# Patient Record
Sex: Male | Born: 1945
Health system: Southern US, Community
[De-identification: ages and names within clinical notes are randomized; demographics above are authoritative.]

## PROBLEM LIST (undated history)

## (undated) DIAGNOSIS — R42 Dizziness and giddiness: Secondary | ICD-10-CM

## (undated) DIAGNOSIS — I10 Essential (primary) hypertension: Secondary | ICD-10-CM

## (undated) DIAGNOSIS — C801 Malignant (primary) neoplasm, unspecified: Secondary | ICD-10-CM

## (undated) HISTORY — PX: OTHER SURGICAL HISTORY: SHX169

## (undated) HISTORY — PX: PROSTATE SURGERY: SHX751

## (undated) HISTORY — PX: SMALL INTESTINE SURGERY: SHX150

## (undated) HISTORY — DX: Malignant (primary) neoplasm, unspecified: C80.1

---

## 2002-09-18 ENCOUNTER — Emergency Department (HOSPITAL_COMMUNITY): Admission: EM | Admit: 2002-09-18 | Discharge: 2002-09-18 | Payer: Self-pay | Admitting: Emergency Medicine

## 2002-10-05 ENCOUNTER — Encounter: Payer: Self-pay | Admitting: Emergency Medicine

## 2002-10-05 ENCOUNTER — Encounter: Admission: RE | Admit: 2002-10-05 | Discharge: 2002-10-05 | Payer: Self-pay | Admitting: Emergency Medicine

## 2003-01-11 ENCOUNTER — Encounter: Payer: Self-pay | Admitting: Urology

## 2003-01-14 ENCOUNTER — Inpatient Hospital Stay (HOSPITAL_COMMUNITY): Admission: RE | Admit: 2003-01-14 | Discharge: 2003-01-17 | Payer: Self-pay | Admitting: Urology

## 2003-01-14 ENCOUNTER — Encounter (INDEPENDENT_AMBULATORY_CARE_PROVIDER_SITE_OTHER): Payer: Self-pay | Admitting: Specialist

## 2004-10-04 ENCOUNTER — Encounter: Admission: RE | Admit: 2004-10-04 | Discharge: 2004-10-04 | Payer: Self-pay | Admitting: Emergency Medicine

## 2005-04-10 ENCOUNTER — Encounter: Admission: RE | Admit: 2005-04-10 | Discharge: 2005-04-10 | Payer: Self-pay | Admitting: Emergency Medicine

## 2005-05-02 ENCOUNTER — Emergency Department (HOSPITAL_COMMUNITY): Admission: EM | Admit: 2005-05-02 | Discharge: 2005-05-02 | Payer: Self-pay | Admitting: Emergency Medicine

## 2007-09-14 ENCOUNTER — Encounter: Admission: RE | Admit: 2007-09-14 | Discharge: 2007-09-14 | Payer: Self-pay | Admitting: Emergency Medicine

## 2011-03-26 NOTE — Op Note (Signed)
NAME:  Maurice Vang, Maurice Vang                          ACCOUNT NO.:  [id number]   MEDICAL RECORD NO.:  [id number]                   PATIENT TYPE:  INP   LOCATION:  0340                                 FACILITY:  [facility]   PHYSICIAN:  Lindaann Slough, M.D.               DATE OF BIRTH:  [birth year]   DATE OF PROCEDURE:  01/14/2003  DATE OF DISCHARGE:                                 OPERATIVE REPORT   PREOPERATIVE DIAGNOSIS:  Adenocarcinoma of prostate.   POSTOPERATIVE DIAGNOSIS:  Adenocarcinoma of prostate.   PROCEDURE DONE:  1. Bilateral pelvic lymphadenectomy.  2. Radical retropubic prostatectomy.   SURGEON:  Lindaann Slough, M.D.   ASSISTANTS:  1. Valetta Fuller, M.D.  2. Crecencio Mc, M.D.   ANESTHESIA:  General.   INDICATION:  The patient is a 65 year old male, who had an elevated PSA.  His PSA was 6.28 with a free PSA of 7.6%.  Biopsies positive for  adenocarcinoma, Gleason score 7.  Treatment options were discussed with the  patient and his wife.  They chose to have a radical prostatectomy.  The  risks, benefits, and alternatives to the procedure were discussed with the  patient and his wife, and they understand and are willing to proceed.   DESCRIPTION OF PROCEDURE:  Under general anesthesia, the patient was prepped  and draped and placed in the supine position.  A longitudinal incision was  made from the symphysis pubis to about 2 cm below the umbilicus.  The  incision was carried down to the rectus fascia which was then incised.  The  recti muscles were split in the midline, and the iliac fossae were then  entered.  Bilateral pelvic lymphadenectomy was done between the pelvic  sidewalls, the external iliac vessels in the bladder wall, and in the  obturator nerve and vessels as landmarks.  The specimen was sent for  permanent section.  Then the endopelvic fascia was incised from the apex at  the base of the prostate on either side.  The loose areolar tissues anterior  to the  bladder neck were ligated with #0 Vicryl and cut in between  ligatures.  Then the puboprostatic ligaments were then incised.  A  Hohenfellner clamp was then passed behind the dorsal vein complex, and the  dorsal vein complex was doubly ligated with #1 Vicryl and cut in between  ligatures.  Then the urethra was bluntly and sharply dissected from the  anterior wall of the rectum, and the anterior wall of the urethra was  incised.  The Foley catheter that was previously placed in the bladder was  then pulled through the incision, and the posterior wall of the urethra was  incised.  The prostate was then bluntly and sharply dissected from the  anterior wall of the rectum.  Although the dissection was difficult, it was  done without rectal injury.  Then the lateral pedicles were then ligated  with #0 silk and cut in between ligatures.  It was at that point difficult  to obtain a clear plane between the seminal vesicals and the Denonvilliers  fascia.  At this point, attention was then turned to the bladder neck which  was then incised.  The dissection was then continued around the bladder  neck, and the anterior bladder neck was incised, and the Foley catheter was  pulled through the incision, and then the posterior bladder neck was  incised.  The dissection was then continued posteriorly, and the plane  between the bladder and the seminal vesical was then obtained, and the  seminal vesicals were dissected from the posterior wall of the bladder.  Then the vas on the left side was identified and freed from the surrounding  tissues and ligated with hemoclips.  Then the same procedure was done on the  right side.  Then the Denonvilliers fascia posterior to the seminal vesical  was then incised, and the dissection was then completed around the seminal  vesicals, and the specimen was removed in toto.  Hemostasis was then  completed with ligatures.  The mucosa of the bladder neck was then everted  with  #4-0 chromic.  Then a [place] sound was passed through the urethra,  and five sutures of #2-0 Monocryl were passed through the urethra at the 2,  the 5, the 7, the 10, and the 12 o'clock position.  The [place] sound was  then removed.  A #20 French Foley catheter was then passed through the  urethra and into the bladder, and the urethral sutures were then  approximated to the bladder neck.  The wound was then irrigated with normal  saline.  A Blake drain was placed in the Retzius space through a separate  stab wound.  Then the fascia was closed with #0 PDS, and the skin was closed  with #4-0 Monocryl, using subcuticular sutures.   ESTIMATED BLOOD LOSS:  2500 mL.   BLOOD REPLACEMENT:  None.   Needle, sponge, and instrument counts were correct on two occasions.   The patient tolerated the procedure well and left the OR in satisfactory  condition to postanesthesia care unit.                                               Lindaann Slough, M.D.    MN/MEDQ  D:  01/14/2003  T:  01/14/2003  Job:  [id number]   cc:   Reuben Likes, M.D.  [address]  [place]  [place] [address]  Fax: [phone]   Valetta Fuller, M.D.  [address]  [place]  [place] [address]  Fax: [phone]   Crecencio Mc, M.D.  [address]  [place], [place] [address]  Fax: [phone]

## 2011-03-26 NOTE — H&P (Signed)
NAME:  Maurice Vang, Maurice Vang                          ACCOUNT NO.:  [id number]   MEDICAL RECORD NO.:  [id number]                   PATIENT TYPE:  INP   LOCATION:  X006                                 FACILITY:  [facility]   PHYSICIAN:  Lindaann Slough, M.D.               DATE OF BIRTH:  May 04, 1946   DATE OF ADMISSION:  01/14/2003  DATE OF DISCHARGE:                                HISTORY & PHYSICAL   CHIEF COMPLAINT:  Adenocarcinoma of prostate, posterior.   HISTORY OF PRESENT ILLNESS:  The patient is a 65 year old male who had a  positive prostate biopsy for adenocarcinoma, Gleason score 7.  His PSA was  6.28, with a free PSA of 7.6%.  Treatment options were discussed with the  patient and his wife, and they chose to have a radical prostatectomy.  He is  admitted today for the procedure.   PAST MEDICAL HISTORY:  1. History of hypertension.  2. Sigmoid colon resection in 1992 for sigmoid diverticulitis.   ALLERGIES:  CODEINE.   FAMILY HISTORY:  Father and mother died of a stroke.   SOCIAL HISTORY:  He is married, has two children.  He quit smoking 24 years  ago, and does not drink.   MEDICATIONS:  1. Furosemide 40 mg daily.  2. Allegra 180 mg p.r.n.  3. Nasonex nasal spray 50 mcg p.r.n.   REVIEW OF SYMPTOMS:  No cough, no shortness of breath, no hemoptysis.  CARDIOVASCULAR:  No palpitations, no chest pain.  GASTROINTESTINAL:  No  nausea or vomiting, no diarrhea, no constipation.  GENITOURINARY:  He has no  frequency, hesitancy, dysuria, or straining on urination.  He has been  having difficulty achieving erections, and woke up was suggestive of  vasculogenic impotence.   PHYSICAL EXAMINATION:  VITAL SIGNS:  Blood pressure 132/78, pulse 70,  respirations 16, temperature 98.  HEENT:  His head is normal.  Pupils equal, round, reactive to light and  accommodation.  Ears, nose, and throat are within normal limits.  NECK:  Supple, no cervical lymph nodes, no thyromegaly.  CHEST:   Symmetrical.  LUNGS:  Fully expanded and clear to auscultation and percussion.  HEART:  Regular rhythm, no murmurs, rubs, or gallops.  ABDOMEN:  Soft.  He has a well-healed surgical scar.  Liver, spleen, and  kidneys are not palpable.  Bowel sounds are normal.  GENITALIA:  Penis and scrotal contents are within normal limits.  EXTREMITIES:  He has decreased pretibial pulses.  RECTAL:  He has no external hemorrhoids, sphincter tone is normal, prostate  is enlarged, 40 g, no nodules.  Seminal vesicles not palpable.   IMPRESSION:  1. Adenocarcinoma of prostate.  2. Peripheral vascular disease.  3. Hypertension.  Lindaann Slough, M.D.    MN/MEDQ  D:  01/14/2003  T:  01/14/2003  Job:  [id number]

## 2011-03-26 NOTE — Discharge Summary (Signed)
NAME:  Maurice Vang, Maurice Vang                          ACCOUNT NO.:  [id number]   MEDICAL RECORD NO.:  [id number]                   PATIENT TYPE:  INP   LOCATION:  0340                                 FACILITY:  [facility]   PHYSICIAN:  Lindaann Slough, M.D.               DATE OF BIRTH:  04-30-1946   DATE OF ADMISSION:  01/14/2003  DATE OF DISCHARGE:  01/17/2003                                 DISCHARGE SUMMARY   DISCHARGE DIAGNOSES:  1. Adenocarcinoma of prostate.  2. Hypertension.  3. Peripheral vascular disease.   PROCEDURE:  Bilateral pelvic lymphadenectomy and radical retropubic  prostatectomy on 01/14/03.   HISTORY OF PRESENT ILLNESS:  The patient is a 65 year old male who had a  positive biopsy for adenocarcinoma of prostate, Gleason score 7.  PSA was  6.28, with a free PSA of 7.6%.  The patient chose to have a radical  prostatectomy, and he was admitted on 01/14/03, for the procedure.   PHYSICAL EXAMINATION:  VITAL SIGNS:  Blood pressure was 132/78, pulse 70,  respirations 16, temperature 98.  HEENT:  Head is normal.  Pupils equal, round, reactive to light and  accommodation.  Ears, nose, and throat within normal limits.  NECK:  Supple, no cervical lymph nodes, no thyromegaly.  CHEST:  Asymmetrical.  LUNGS:  Fully expanded and clear to auscultation and percussion.  HEART:  Regular rhythm.  No murmurs, no gallops.  ABDOMEN:  Soft, he has a well-healed surgical scar.  Liver, spleen, and  kidneys not palpable.  Bowel sounds normal.  GENITOURINARY:  Penis and scrotal contents within normal limits.  RECTAL:  Sphincter tone is normal.  Prostate was enlarged at 40 g, no  nodules.  Seminal vesicles not palpable.   LABORATORY DATA:  His hemoglobin on admission was 14.9, hematocrit of 43.6,  and white blood cell count of 3.8.  Sodium 140, potassium 3.9, BUN 16,  creatinine 1.3, glucose 110.  Urinalysis is normal.  PT and PTT were within  normal limits.   Chest x-ray showed no evidence of  active disease.   EKG showed a left axis deviation.   Urine culture is negative.   HOSPITAL COURSE:  The patient had bilateral pelvic lymphadenectomy and  radical retropubic prostatectomy on 01/14/03.   His hemoglobin went down to 7 on the first day postoperatively.  However,  the patient was stable.  His vital signs were stable, and he was ambulating  well, and it was decided not to transfuse.   The postoperative course was otherwise uneventful.  He tolerated his diet  well the first two days postoperatively, and the Foley catheter was draining  clear urine.  In the morning of 01/17/03, he had one episode of nausea and  vomiting, and he was kept for observation for the rest of the day, and he  tolerated his lunch and dinner well, and he was then discharged home on  01/17/03.  DISCHARGE MEDICATIONS:  1. Levaquin 250 mg p.o. daily, to stop taking on 01/26/03, two days before     removal of the Foley catheter.  2. Ferrous sulfate one tablet p.o. t.i.d.  3. Colace 100 mg p.o. t.i.d.  4. Vicodin one or two tablets p.o. q.4h. p.r.n. pain.  5. Furosemide 40 mg p.o. daily.  6. Allegra 180 mg p.r.n.  7. Nasonex 50 mcg p.r.n.   ACTIVITY:  He is instructed not to do any lifting, straining, or driving  until further advised.   CONDITION ON DISCHARGE:  Improved.   DIET:  Regular.   FOLLOWUP:  The patient will be seen in the office on 01/28/03, for removal of  the Foley catheter.   PATHOLOGY REPORT:  Pathology report showed adenocarcinoma of the prostate,  Gleason score 7, lymph nodes are negative, and the margins are negative.                                               Lindaann Slough, M.D.    MN/MEDQ  D:  01/17/2003  T:  01/17/2003  Job:  [id number]   cc:   Reuben Likes, M.D.  [address]  [place]  [place] [address]  Fax: [phone]

## 2011-08-10 ENCOUNTER — Other Ambulatory Visit: Payer: Self-pay | Admitting: Gastroenterology

## 2012-07-06 DIAGNOSIS — E663 Overweight: Secondary | ICD-10-CM | POA: Diagnosis not present

## 2012-07-06 DIAGNOSIS — Z23 Encounter for immunization: Secondary | ICD-10-CM | POA: Diagnosis not present

## 2012-07-06 DIAGNOSIS — I10 Essential (primary) hypertension: Secondary | ICD-10-CM | POA: Diagnosis not present

## 2012-07-06 DIAGNOSIS — Z0000 Encounter for general adult medical examination without abnormal findings: Secondary | ICD-10-CM | POA: Diagnosis not present

## 2012-07-06 DIAGNOSIS — G4730 Sleep apnea, unspecified: Secondary | ICD-10-CM | POA: Diagnosis not present

## 2012-07-26 DIAGNOSIS — R6889 Other general symptoms and signs: Secondary | ICD-10-CM | POA: Diagnosis not present

## 2012-07-26 DIAGNOSIS — R109 Unspecified abdominal pain: Secondary | ICD-10-CM | POA: Diagnosis not present

## 2012-07-26 DIAGNOSIS — M25519 Pain in unspecified shoulder: Secondary | ICD-10-CM | POA: Diagnosis not present

## 2012-07-26 DIAGNOSIS — J329 Chronic sinusitis, unspecified: Secondary | ICD-10-CM | POA: Diagnosis not present

## 2012-07-31 ENCOUNTER — Other Ambulatory Visit: Payer: Self-pay | Admitting: Orthopedic Surgery

## 2012-07-31 DIAGNOSIS — M25512 Pain in left shoulder: Secondary | ICD-10-CM

## 2012-07-31 DIAGNOSIS — M25519 Pain in unspecified shoulder: Secondary | ICD-10-CM | POA: Diagnosis not present

## 2012-08-05 ENCOUNTER — Ambulatory Visit
Admission: RE | Admit: 2012-08-05 | Discharge: 2012-08-05 | Disposition: A | Discharge status: Home / Self Care | Payer: Medicare Other | Source: Ambulatory Visit | Attending: Orthopedic Surgery | Admitting: Orthopedic Surgery

## 2012-08-05 DIAGNOSIS — M25519 Pain in unspecified shoulder: Secondary | ICD-10-CM | POA: Diagnosis not present

## 2012-08-05 DIAGNOSIS — M25512 Pain in left shoulder: Secondary | ICD-10-CM

## 2012-08-07 DIAGNOSIS — M25519 Pain in unspecified shoulder: Secondary | ICD-10-CM | POA: Diagnosis not present

## 2013-08-24 DIAGNOSIS — G4730 Sleep apnea, unspecified: Secondary | ICD-10-CM | POA: Diagnosis not present

## 2013-08-24 DIAGNOSIS — J069 Acute upper respiratory infection, unspecified: Secondary | ICD-10-CM | POA: Diagnosis not present

## 2013-08-24 DIAGNOSIS — I10 Essential (primary) hypertension: Secondary | ICD-10-CM | POA: Diagnosis not present

## 2014-02-22 DIAGNOSIS — I10 Essential (primary) hypertension: Secondary | ICD-10-CM | POA: Diagnosis not present

## 2014-02-22 DIAGNOSIS — J309 Allergic rhinitis, unspecified: Secondary | ICD-10-CM | POA: Diagnosis not present

## 2014-03-20 ENCOUNTER — Ambulatory Visit (INDEPENDENT_AMBULATORY_CARE_PROVIDER_SITE_OTHER): Payer: Medicare Other | Admitting: Emergency Medicine

## 2014-03-20 VITALS — BP 120/82 | HR 82 | Temp 97.7°F | Resp 16 | Ht 70.0 in | Wt 234.4 lb

## 2014-03-20 DIAGNOSIS — S335XXA Sprain of ligaments of lumbar spine, initial encounter: Secondary | ICD-10-CM

## 2014-03-20 MED ORDER — NAPROXEN SODIUM 550 MG PO TABS: 550.0000 mg | ORAL_TABLET | Freq: Two times a day (BID) | ORAL | Status: AC

## 2014-03-20 MED ORDER — CYCLOBENZAPRINE HCL 10 MG PO TABS: 10.0000 mg | ORAL_TABLET | Freq: Three times a day (TID) | ORAL | Status: DC | PRN

## 2014-03-20 NOTE — Patient Instructions (Signed)
Lumbosacral Strain Lumbosacral strain is a strain of any of the parts that make up your lumbosacral vertebrae. Your lumbosacral vertebrae are the bones that make up the lower third of your backbone. Your lumbosacral vertebrae are held together by muscles and tough, fibrous tissue (ligaments).  CAUSES  A sudden blow to your back can cause lumbosacral strain. Also, anything that causes an excessive stretch of the muscles in the low back can cause this strain. This is typically seen when people exert themselves strenuously, fall, lift heavy objects, bend, or crouch repeatedly. RISK FACTORS  Physically demanding work.  Participation in pushing or pulling sports or sports that require sudden twist of the back (tennis, golf, baseball).  Weight lifting.  Excessive lower back curvature.  Forward-tilted pelvis.  Weak back or abdominal muscles or both.  Tight hamstrings. SIGNS AND SYMPTOMS  Lumbosacral strain may cause pain in the area of your injury or pain that moves (radiates) down your leg.  DIAGNOSIS Your health care provider can often diagnose lumbosacral strain through a physical exam. In some cases, you may need tests such as X-ray exams.  TREATMENT  Treatment for your lower back injury depends on many factors that your clinician will have to evaluate. However, most treatment will include the use of anti-inflammatory medicines. HOME CARE INSTRUCTIONS   Avoid hard physical activities (tennis, racquetball, waterskiing) if you are not in proper physical condition for it. This may aggravate or create problems.  If you have a back problem, avoid sports requiring sudden body movements. Swimming and walking are generally safer activities.  Maintain good posture.  Maintain a healthy weight.  For acute conditions, you may put ice on the injured area.  Put ice in a plastic bag.  Place a towel between your skin and the bag.  Leave the ice on for 20 minutes, 2 3 times a day.  When the  low back starts healing, stretching and strengthening exercises may be recommended. SEEK MEDICAL CARE IF:  Your back pain is getting worse.  You experience severe back pain not relieved with medicines. SEEK IMMEDIATE MEDICAL CARE IF:   You have numbness, tingling, weakness, or problems with the use of your arms or legs.  There is a change in bowel or bladder control.  You have increasing pain in any area of the body, including your belly (abdomen).  You notice shortness of breath, dizziness, or feel faint.  You feel sick to your stomach (nauseous), are throwing up (vomiting), or become sweaty.  You notice discoloration of your toes or legs, or your feet get very cold. MAKE SURE YOU:   Understand these instructions.  Will watch your condition.  Will get help right away if you are not doing well or get worse. Document Released: 08/04/2005 Document Revised: 08/15/2013 Document Reviewed: 06/13/2013 Exit[employer] Patient Information 2014 [employer].  

## 2014-03-20 NOTE — Progress Notes (Signed)
[facility] [address] [place] [address] [phone]  Date:  03/20/2014   Name:  Maurice Vang   DOB:  12-24-1945   MRN:  [id number]  PCP:  No primary provider on file.    Chief Complaint: Back Pain   History of Present Illness:  Maurice Vang is a 68 y.o. very pleasant male patient who presents with the following:  Injured his right low back getting out of a car and twisted.  Has pain that radiates into anterior right thigh.  Had difficulty with getting out of bed this morning due pain and required assistance. No neuro symptoms or weakness.  No history or prior sciatica.  No improvement with over the counter medications or other home remedies. Denies other complaint or health concern today.   There are no active problems to display for this patient.   Past Medical History  Diagnosis Date  . Cancer     prostate    Past Surgical History  Procedure Laterality Date  . Prostate cancer    . Prostate surgery    . Small intestine surgery      History  Substance Use Topics  . Smoking status: Former Research scientist (life sciences)  . Smokeless tobacco: Not on file  . Alcohol Use: No    Family History  Problem Relation Age of Onset  . Stroke Mother   . Stroke Father     Allergies  Allergen Reactions  . Codeine Nausea And Vomiting    Medication list has been reviewed and updated.  No current outpatient prescriptions on file prior to visit.   No current facility-administered medications on file prior to visit.    Review of Systems:  As per HPI, otherwise negative.    Physical Examination: Filed Vitals:   03/20/14 1153  BP: 120/82  Pulse: 82  Temp: 97.7 F (36.5 C)  Resp: 16   Filed Vitals:   03/20/14 1153  Height: 5\' 10"  (1.778 m)  Weight: 234 lb 6.4 oz (106.323 kg)   Body mass index is 33.63 kg/(m^2). Ideal Body Weight: Weight in (lb) to have BMI = 25: 173.9  GEN: WDWN, NAD, Non-toxic, A & O x 3 HEENT: Atraumatic, Normocephalic. Neck  supple. No masses, No LAD. Ears and Nose: No external deformity. CV: RRR, No M/G/R. No JVD. No thrill. No extra heart sounds. PULM: CTA B, no wheezes, crackles, rhonchi. No retractions. No resp. distress. No accessory muscle use. ABD: S, NT, ND, +BS. No rebound. No HSM. EXTR: No c/c/e NEURO Normal gait.  PSYCH: Normally interactive. Conversant. Not depressed or anxious appearing.  Calm demeanor.  Back:  Tender right lower back neuro intact  Assessment and Plan: Lumbar strain Anaprox Flexeril  Signed,  Ellison Carwin, MD   2

## 2014-06-14 DIAGNOSIS — M545 Low back pain, unspecified: Secondary | ICD-10-CM | POA: Diagnosis not present

## 2015-02-15 DIAGNOSIS — H25043 Posterior subcapsular polar age-related cataract, bilateral: Secondary | ICD-10-CM | POA: Diagnosis not present

## 2015-03-07 DIAGNOSIS — C61 Malignant neoplasm of prostate: Secondary | ICD-10-CM | POA: Diagnosis not present

## 2015-03-07 DIAGNOSIS — G4730 Sleep apnea, unspecified: Secondary | ICD-10-CM | POA: Diagnosis not present

## 2015-03-07 DIAGNOSIS — Z1389 Encounter for screening for other disorder: Secondary | ICD-10-CM | POA: Diagnosis not present

## 2015-03-07 DIAGNOSIS — I10 Essential (primary) hypertension: Secondary | ICD-10-CM | POA: Diagnosis not present

## 2015-03-07 DIAGNOSIS — Z0001 Encounter for general adult medical examination with abnormal findings: Secondary | ICD-10-CM | POA: Diagnosis not present

## 2015-03-07 DIAGNOSIS — R609 Edema, unspecified: Secondary | ICD-10-CM | POA: Diagnosis not present

## 2015-09-05 DIAGNOSIS — Z6835 Body mass index (BMI) 35.0-35.9, adult: Secondary | ICD-10-CM | POA: Diagnosis not present

## 2015-09-05 DIAGNOSIS — G4730 Sleep apnea, unspecified: Secondary | ICD-10-CM | POA: Diagnosis not present

## 2015-09-05 DIAGNOSIS — E663 Overweight: Secondary | ICD-10-CM | POA: Diagnosis not present

## 2015-09-05 DIAGNOSIS — I10 Essential (primary) hypertension: Secondary | ICD-10-CM | POA: Diagnosis not present

## 2015-12-29 DIAGNOSIS — M25561 Pain in right knee: Secondary | ICD-10-CM | POA: Diagnosis not present

## 2015-12-30 DIAGNOSIS — M25561 Pain in right knee: Secondary | ICD-10-CM | POA: Diagnosis not present

## 2016-01-06 DIAGNOSIS — M25561 Pain in right knee: Secondary | ICD-10-CM | POA: Diagnosis not present

## 2016-01-10 DIAGNOSIS — M25561 Pain in right knee: Secondary | ICD-10-CM | POA: Diagnosis not present

## 2016-01-13 DIAGNOSIS — M25561 Pain in right knee: Secondary | ICD-10-CM | POA: Diagnosis not present

## 2016-01-28 DIAGNOSIS — Y939 Activity, unspecified: Secondary | ICD-10-CM | POA: Diagnosis not present

## 2016-01-28 DIAGNOSIS — S83281A Other tear of lateral meniscus, current injury, right knee, initial encounter: Secondary | ICD-10-CM | POA: Diagnosis not present

## 2016-01-28 DIAGNOSIS — S83231A Complex tear of medial meniscus, current injury, right knee, initial encounter: Secondary | ICD-10-CM | POA: Diagnosis not present

## 2016-01-28 DIAGNOSIS — Y999 Unspecified external cause status: Secondary | ICD-10-CM | POA: Diagnosis not present

## 2016-01-28 DIAGNOSIS — X58XXXA Exposure to other specified factors, initial encounter: Secondary | ICD-10-CM | POA: Diagnosis not present

## 2016-01-28 DIAGNOSIS — G8918 Other acute postprocedural pain: Secondary | ICD-10-CM | POA: Diagnosis not present

## 2016-01-28 DIAGNOSIS — Y929 Unspecified place or not applicable: Secondary | ICD-10-CM | POA: Diagnosis not present

## 2016-01-28 DIAGNOSIS — M179 Osteoarthritis of knee, unspecified: Secondary | ICD-10-CM | POA: Diagnosis not present

## 2016-01-28 DIAGNOSIS — S83241A Other tear of medial meniscus, current injury, right knee, initial encounter: Secondary | ICD-10-CM | POA: Diagnosis not present

## 2016-02-02 DIAGNOSIS — M25561 Pain in right knee: Secondary | ICD-10-CM | POA: Diagnosis not present

## 2016-02-02 DIAGNOSIS — R262 Difficulty in walking, not elsewhere classified: Secondary | ICD-10-CM | POA: Diagnosis not present

## 2016-02-03 DIAGNOSIS — S83281D Other tear of lateral meniscus, current injury, right knee, subsequent encounter: Secondary | ICD-10-CM | POA: Diagnosis not present

## 2016-02-03 DIAGNOSIS — S83241D Other tear of medial meniscus, current injury, right knee, subsequent encounter: Secondary | ICD-10-CM | POA: Diagnosis not present

## 2016-02-04 DIAGNOSIS — M25561 Pain in right knee: Secondary | ICD-10-CM | POA: Diagnosis not present

## 2016-02-04 DIAGNOSIS — R262 Difficulty in walking, not elsewhere classified: Secondary | ICD-10-CM | POA: Diagnosis not present

## 2016-02-09 DIAGNOSIS — M25561 Pain in right knee: Secondary | ICD-10-CM | POA: Diagnosis not present

## 2016-02-09 DIAGNOSIS — R262 Difficulty in walking, not elsewhere classified: Secondary | ICD-10-CM | POA: Diagnosis not present

## 2016-02-11 DIAGNOSIS — R262 Difficulty in walking, not elsewhere classified: Secondary | ICD-10-CM | POA: Diagnosis not present

## 2016-02-11 DIAGNOSIS — M25561 Pain in right knee: Secondary | ICD-10-CM | POA: Diagnosis not present

## 2016-02-16 DIAGNOSIS — R262 Difficulty in walking, not elsewhere classified: Secondary | ICD-10-CM | POA: Diagnosis not present

## 2016-02-16 DIAGNOSIS — M25561 Pain in right knee: Secondary | ICD-10-CM | POA: Diagnosis not present

## 2016-02-18 DIAGNOSIS — R262 Difficulty in walking, not elsewhere classified: Secondary | ICD-10-CM | POA: Diagnosis not present

## 2016-02-18 DIAGNOSIS — M25561 Pain in right knee: Secondary | ICD-10-CM | POA: Diagnosis not present

## 2016-02-24 DIAGNOSIS — M25561 Pain in right knee: Secondary | ICD-10-CM | POA: Diagnosis not present

## 2016-02-24 DIAGNOSIS — R262 Difficulty in walking, not elsewhere classified: Secondary | ICD-10-CM | POA: Diagnosis not present

## 2016-03-01 DIAGNOSIS — M25561 Pain in right knee: Secondary | ICD-10-CM | POA: Diagnosis not present

## 2016-03-01 DIAGNOSIS — R262 Difficulty in walking, not elsewhere classified: Secondary | ICD-10-CM | POA: Diagnosis not present

## 2016-03-08 DIAGNOSIS — Z0000 Encounter for general adult medical examination without abnormal findings: Secondary | ICD-10-CM | POA: Diagnosis not present

## 2016-03-08 DIAGNOSIS — I10 Essential (primary) hypertension: Secondary | ICD-10-CM | POA: Diagnosis not present

## 2016-03-08 DIAGNOSIS — Z6835 Body mass index (BMI) 35.0-35.9, adult: Secondary | ICD-10-CM | POA: Diagnosis not present

## 2016-03-08 DIAGNOSIS — Z125 Encounter for screening for malignant neoplasm of prostate: Secondary | ICD-10-CM | POA: Diagnosis not present

## 2016-03-08 DIAGNOSIS — G4730 Sleep apnea, unspecified: Secondary | ICD-10-CM | POA: Diagnosis not present

## 2016-03-08 DIAGNOSIS — Z1389 Encounter for screening for other disorder: Secondary | ICD-10-CM | POA: Diagnosis not present

## 2016-03-08 DIAGNOSIS — K635 Polyp of colon: Secondary | ICD-10-CM | POA: Diagnosis not present

## 2016-03-08 DIAGNOSIS — E663 Overweight: Secondary | ICD-10-CM | POA: Diagnosis not present

## 2016-03-08 DIAGNOSIS — Z8546 Personal history of malignant neoplasm of prostate: Secondary | ICD-10-CM | POA: Diagnosis not present

## 2016-03-09 DIAGNOSIS — R262 Difficulty in walking, not elsewhere classified: Secondary | ICD-10-CM | POA: Diagnosis not present

## 2016-03-09 DIAGNOSIS — M25561 Pain in right knee: Secondary | ICD-10-CM | POA: Diagnosis not present

## 2016-03-18 DIAGNOSIS — R262 Difficulty in walking, not elsewhere classified: Secondary | ICD-10-CM | POA: Diagnosis not present

## 2016-03-18 DIAGNOSIS — M25561 Pain in right knee: Secondary | ICD-10-CM | POA: Diagnosis not present

## 2016-03-24 DIAGNOSIS — M25561 Pain in right knee: Secondary | ICD-10-CM | POA: Diagnosis not present

## 2016-03-24 DIAGNOSIS — R262 Difficulty in walking, not elsewhere classified: Secondary | ICD-10-CM | POA: Diagnosis not present

## 2016-05-04 DIAGNOSIS — M25561 Pain in right knee: Secondary | ICD-10-CM | POA: Diagnosis not present

## 2016-05-13 DIAGNOSIS — G4733 Obstructive sleep apnea (adult) (pediatric): Secondary | ICD-10-CM | POA: Diagnosis not present

## 2016-06-01 DIAGNOSIS — M25561 Pain in right knee: Secondary | ICD-10-CM | POA: Diagnosis not present

## 2016-06-04 DIAGNOSIS — M25561 Pain in right knee: Secondary | ICD-10-CM | POA: Diagnosis not present

## 2016-06-07 DIAGNOSIS — M1711 Unilateral primary osteoarthritis, right knee: Secondary | ICD-10-CM | POA: Diagnosis not present

## 2016-06-14 DIAGNOSIS — M1711 Unilateral primary osteoarthritis, right knee: Secondary | ICD-10-CM | POA: Diagnosis not present

## 2016-06-22 DIAGNOSIS — M1711 Unilateral primary osteoarthritis, right knee: Secondary | ICD-10-CM | POA: Diagnosis not present

## 2016-07-20 DIAGNOSIS — M1711 Unilateral primary osteoarthritis, right knee: Secondary | ICD-10-CM | POA: Diagnosis not present

## 2016-08-12 DIAGNOSIS — G4730 Sleep apnea, unspecified: Secondary | ICD-10-CM | POA: Diagnosis not present

## 2016-11-16 DIAGNOSIS — J069 Acute upper respiratory infection, unspecified: Secondary | ICD-10-CM | POA: Diagnosis not present

## 2016-12-09 DIAGNOSIS — H25043 Posterior subcapsular polar age-related cataract, bilateral: Secondary | ICD-10-CM | POA: Diagnosis not present

## 2017-03-24 DIAGNOSIS — Z1389 Encounter for screening for other disorder: Secondary | ICD-10-CM | POA: Diagnosis not present

## 2017-03-24 DIAGNOSIS — Z8546 Personal history of malignant neoplasm of prostate: Secondary | ICD-10-CM | POA: Diagnosis not present

## 2017-03-24 DIAGNOSIS — G4730 Sleep apnea, unspecified: Secondary | ICD-10-CM | POA: Diagnosis not present

## 2017-03-24 DIAGNOSIS — Z125 Encounter for screening for malignant neoplasm of prostate: Secondary | ICD-10-CM | POA: Diagnosis not present

## 2017-03-24 DIAGNOSIS — Z0000 Encounter for general adult medical examination without abnormal findings: Secondary | ICD-10-CM | POA: Diagnosis not present

## 2017-03-24 DIAGNOSIS — I10 Essential (primary) hypertension: Secondary | ICD-10-CM | POA: Diagnosis not present

## 2017-03-24 DIAGNOSIS — Z86010 Personal history of colonic polyps: Secondary | ICD-10-CM | POA: Diagnosis not present

## 2017-03-24 DIAGNOSIS — Z6836 Body mass index (BMI) 36.0-36.9, adult: Secondary | ICD-10-CM | POA: Diagnosis not present

## 2017-03-24 DIAGNOSIS — Z23 Encounter for immunization: Secondary | ICD-10-CM | POA: Diagnosis not present

## 2017-03-24 DIAGNOSIS — E663 Overweight: Secondary | ICD-10-CM | POA: Diagnosis not present

## 2017-03-31 DIAGNOSIS — I10 Essential (primary) hypertension: Secondary | ICD-10-CM | POA: Diagnosis not present

## 2017-05-17 DIAGNOSIS — G4733 Obstructive sleep apnea (adult) (pediatric): Secondary | ICD-10-CM | POA: Diagnosis not present

## 2017-05-23 DIAGNOSIS — J069 Acute upper respiratory infection, unspecified: Secondary | ICD-10-CM | POA: Diagnosis not present

## 2017-09-07 ENCOUNTER — Other Ambulatory Visit: Payer: Self-pay | Admitting: Gastroenterology

## 2017-09-07 DIAGNOSIS — K625 Hemorrhage of anus and rectum: Secondary | ICD-10-CM | POA: Diagnosis not present

## 2017-09-07 DIAGNOSIS — R1031 Right lower quadrant pain: Secondary | ICD-10-CM

## 2017-09-07 DIAGNOSIS — R1032 Left lower quadrant pain: Secondary | ICD-10-CM | POA: Diagnosis not present

## 2017-09-07 DIAGNOSIS — Z86010 Personal history of colonic polyps: Secondary | ICD-10-CM | POA: Diagnosis not present

## 2017-09-07 DIAGNOSIS — K6289 Other specified diseases of anus and rectum: Secondary | ICD-10-CM | POA: Diagnosis not present

## 2017-09-07 DIAGNOSIS — Z8719 Personal history of other diseases of the digestive system: Secondary | ICD-10-CM | POA: Diagnosis not present

## 2017-09-12 ENCOUNTER — Ambulatory Visit
Admission: RE | Admit: 2017-09-12 | Discharge: 2017-09-12 | Disposition: A | Discharge status: Home / Self Care | Payer: Medicare Other | Source: Ambulatory Visit | Attending: Gastroenterology | Admitting: Gastroenterology

## 2017-09-12 DIAGNOSIS — R1031 Right lower quadrant pain: Secondary | ICD-10-CM

## 2017-09-12 DIAGNOSIS — K8020 Calculus of gallbladder without cholecystitis without obstruction: Secondary | ICD-10-CM | POA: Diagnosis not present

## 2017-09-12 MED ORDER — IOPAMIDOL (ISOVUE-300) INJECTION 61%
100.0000 mL | Freq: Once | INTRAVENOUS | Status: AC | PRN
  Administered 2017-09-12: 100 mL via INTRAVENOUS

## 2017-11-23 DIAGNOSIS — K625 Hemorrhage of anus and rectum: Secondary | ICD-10-CM | POA: Diagnosis not present

## 2017-11-23 DIAGNOSIS — Z86010 Personal history of colonic polyps: Secondary | ICD-10-CM | POA: Diagnosis not present

## 2017-11-23 DIAGNOSIS — K5733 Diverticulitis of large intestine without perforation or abscess with bleeding: Secondary | ICD-10-CM | POA: Diagnosis not present

## 2017-11-23 DIAGNOSIS — Z8719 Personal history of other diseases of the digestive system: Secondary | ICD-10-CM | POA: Diagnosis not present

## 2017-12-16 ENCOUNTER — Other Ambulatory Visit: Payer: Self-pay | Admitting: Gastroenterology

## 2017-12-16 DIAGNOSIS — R1032 Left lower quadrant pain: Secondary | ICD-10-CM

## 2017-12-16 DIAGNOSIS — Z8719 Personal history of other diseases of the digestive system: Secondary | ICD-10-CM

## 2017-12-19 DIAGNOSIS — H25043 Posterior subcapsular polar age-related cataract, bilateral: Secondary | ICD-10-CM | POA: Diagnosis not present

## 2017-12-23 ENCOUNTER — Ambulatory Visit
Admission: RE | Admit: 2017-12-23 | Discharge: 2017-12-23 | Disposition: A | Discharge status: Home / Self Care | Payer: Medicare Other | Source: Ambulatory Visit | Attending: Gastroenterology | Admitting: Gastroenterology

## 2017-12-23 DIAGNOSIS — Z8719 Personal history of other diseases of the digestive system: Secondary | ICD-10-CM

## 2017-12-23 DIAGNOSIS — R1032 Left lower quadrant pain: Secondary | ICD-10-CM

## 2017-12-23 DIAGNOSIS — K5730 Diverticulosis of large intestine without perforation or abscess without bleeding: Secondary | ICD-10-CM | POA: Diagnosis not present

## 2017-12-23 MED ORDER — IOPAMIDOL (ISOVUE-300) INJECTION 61%
125.0000 mL | Freq: Once | INTRAVENOUS | Status: AC | PRN
  Administered 2017-12-23: 125 mL via INTRAVENOUS

## 2017-12-24 ENCOUNTER — Other Ambulatory Visit: Payer: PRIVATE HEALTH INSURANCE

## 2017-12-26 DIAGNOSIS — K6389 Other specified diseases of intestine: Secondary | ICD-10-CM | POA: Diagnosis not present

## 2017-12-26 DIAGNOSIS — K625 Hemorrhage of anus and rectum: Secondary | ICD-10-CM | POA: Diagnosis not present

## 2017-12-26 DIAGNOSIS — K5730 Diverticulosis of large intestine without perforation or abscess without bleeding: Secondary | ICD-10-CM | POA: Diagnosis not present

## 2017-12-26 DIAGNOSIS — D126 Benign neoplasm of colon, unspecified: Secondary | ICD-10-CM | POA: Diagnosis not present

## 2017-12-26 DIAGNOSIS — K644 Residual hemorrhoidal skin tags: Secondary | ICD-10-CM | POA: Diagnosis not present

## 2017-12-26 DIAGNOSIS — Z980 Intestinal bypass and anastomosis status: Secondary | ICD-10-CM | POA: Diagnosis not present

## 2017-12-26 DIAGNOSIS — K648 Other hemorrhoids: Secondary | ICD-10-CM | POA: Diagnosis not present

## 2017-12-28 DIAGNOSIS — D126 Benign neoplasm of colon, unspecified: Secondary | ICD-10-CM | POA: Diagnosis not present

## 2017-12-28 DIAGNOSIS — K6389 Other specified diseases of intestine: Secondary | ICD-10-CM | POA: Diagnosis not present

## 2018-03-09 DIAGNOSIS — Z86010 Personal history of colonic polyps: Secondary | ICD-10-CM | POA: Diagnosis not present

## 2018-03-09 DIAGNOSIS — K5733 Diverticulitis of large intestine without perforation or abscess with bleeding: Secondary | ICD-10-CM | POA: Diagnosis not present

## 2018-03-09 DIAGNOSIS — K625 Hemorrhage of anus and rectum: Secondary | ICD-10-CM | POA: Diagnosis not present

## 2018-03-09 DIAGNOSIS — Z8719 Personal history of other diseases of the digestive system: Secondary | ICD-10-CM | POA: Diagnosis not present

## 2018-04-07 DIAGNOSIS — Z8546 Personal history of malignant neoplasm of prostate: Secondary | ICD-10-CM | POA: Diagnosis not present

## 2018-04-07 DIAGNOSIS — Z0000 Encounter for general adult medical examination without abnormal findings: Secondary | ICD-10-CM | POA: Diagnosis not present

## 2018-04-07 DIAGNOSIS — G4730 Sleep apnea, unspecified: Secondary | ICD-10-CM | POA: Diagnosis not present

## 2018-04-07 DIAGNOSIS — I10 Essential (primary) hypertension: Secondary | ICD-10-CM | POA: Diagnosis not present

## 2018-04-07 DIAGNOSIS — Z1389 Encounter for screening for other disorder: Secondary | ICD-10-CM | POA: Diagnosis not present

## 2018-04-07 DIAGNOSIS — Z23 Encounter for immunization: Secondary | ICD-10-CM | POA: Diagnosis not present

## 2018-06-01 DIAGNOSIS — G4733 Obstructive sleep apnea (adult) (pediatric): Secondary | ICD-10-CM | POA: Diagnosis not present

## 2018-06-01 DIAGNOSIS — H6123 Impacted cerumen, bilateral: Secondary | ICD-10-CM | POA: Diagnosis not present

## 2018-09-05 ENCOUNTER — Encounter (HOSPITAL_COMMUNITY): Payer: Self-pay | Admitting: Emergency Medicine

## 2018-09-05 ENCOUNTER — Ambulatory Visit (HOSPITAL_COMMUNITY)
Admission: EM | Admit: 2018-09-05 | Discharge: 2018-09-05 | Disposition: A | Discharge status: Home / Self Care | Payer: Medicare Other | Attending: Family Medicine | Admitting: Family Medicine

## 2018-09-05 DIAGNOSIS — T7840XA Allergy, unspecified, initial encounter: Secondary | ICD-10-CM | POA: Diagnosis not present

## 2018-09-05 HISTORY — DX: Essential (primary) hypertension: I10

## 2018-09-05 MED ORDER — PREDNISONE 20 MG PO TABS: 20.0000 mg | ORAL_TABLET | Freq: Two times a day (BID) | ORAL | 0 refills | Status: DC

## 2018-09-05 MED ORDER — CEPHALEXIN 500 MG PO CAPS: 500.0000 mg | ORAL_CAPSULE | Freq: Three times a day (TID) | ORAL | 0 refills | Status: DC

## 2018-09-05 MED ORDER — CEPHALEXIN 500 MG PO CAPS: 500.0000 mg | ORAL_CAPSULE | Freq: Four times a day (QID) | ORAL | 0 refills | Status: DC

## 2018-09-05 NOTE — Discharge Instructions (Signed)
Take the prednisone 2 x a day Take the antibiotic 3 x a day Expect improvement in 12-24-hours Return promptly if worse - increased swelling, pain or fever

## 2018-09-05 NOTE — ED Triage Notes (Signed)
PT was stung by a bee 10/19 on right cheek. PT reports facial swelling started last night. Swelling is over right cheek and is associated with itching and burning. No airway involvement.

## 2018-09-08 NOTE — ED Provider Notes (Signed)
[name]    CSN: [id number] Arrival date & time: 09/05/18  2229     History   Chief Complaint Chief Complaint  Patient presents with  . Facial Swelling    HPI Maurice Vang is a 72 y.o. male.   HPI  Patient was stung by a bee on 1019 on his right cheek.  He did not have any initial symptoms, some burning and mild swelling, but then Yesterday he notices face was starting to swell.  Today he has noticeable asymmetry of his face.  Pain.  No itching.  No problems with vision.  No trouble breathing or swallowing.  He has had some allergies to bees but was when he was young, over 50 years ago, and he does not recall symptoms.  He is otherwise well.  Past Medical History:  Diagnosis Date  . Cancer [facility])    prostate  . Hypertension     There are no active problems to display for this patient.   Past Surgical History:  Procedure Laterality Date  . prostate cancer    . PROSTATE SURGERY    . SMALL INTESTINE SURGERY         Home Medications    Prior to Admission medications   Medication Sig Start Date End Date Taking? Authorizing Provider  aspirin 81 MG tablet Take 81 mg by mouth daily.   Yes [provider]  Cholecalciferol ([employer] VIT D 5000 HIGH-POTENCY PO) Take 1 tablet by mouth daily.   Yes [provider]  hydrochlorothiazide (HYDRODIURIL) 25 MG tablet Take 25 mg by mouth daily.   Yes [provider]  ramipril (ALTACE) 5 MG capsule Take 5 mg by mouth daily.   Yes [provider]  cephALEXin (KEFLEX) 500 MG capsule Take 1 capsule (500 mg total) by mouth 3 (three) times daily. 09/05/18   Raylene Everts, MD  predniSONE (DELTASONE) 20 MG tablet Take 1 tablet (20 mg total) by mouth 2 (two) times daily with a meal. 09/05/18   Raylene Everts, MD    Family History Family History  Problem Relation Age of Onset  . Stroke Mother   . Stroke Father     Social History Social History   Tobacco Use  . Smoking status:  Former Smoker  Substance Use Topics  . Alcohol use: No  . Drug use: No     Allergies   Codeine   Review of Systems Review of Systems  Constitutional: Negative for chills and fever.  HENT: Negative for ear pain and sore throat.   Eyes: Negative for pain and visual disturbance.  Respiratory: Negative for cough and shortness of breath.   Cardiovascular: Negative for chest pain and palpitations.  Gastrointestinal: Negative for abdominal pain and vomiting.  Genitourinary: Negative for dysuria and hematuria.  Musculoskeletal: Negative for arthralgias and back pain.  Skin: Negative for color change and rash.  Neurological: Positive for facial asymmetry. Negative for seizures and syncope.  All other systems reviewed and are negative.    Physical Exam Triage Vital Signs ED Triage Vitals  Enc Vitals Group     BP 09/05/18 0818 (!) 144/91     Pulse Rate 09/05/18 0818 81     Resp 09/05/18 0818 16     Temp 09/05/18 0818 97.8 F (36.6 C)     Temp Source 09/05/18 0818 Oral     SpO2 09/05/18 0818 97 %     Weight --      Height --  Head Circumference --      Peak Flow --      Pain Score 09/05/18 0819 3     Pain Loc --      Pain Edu? --      Excl. in [place]? --    No data found.  Updated Vital Signs BP (!) 144/91 (BP Location: Left Arm)   Pulse 81   Temp 97.8 F (36.6 C) (Oral)   Resp 16   SpO2 97%   Visual Acuity Right Eye Distance:   Left Eye Distance:   Bilateral Distance:    Right Eye Near:   Left Eye Near:    Bilateral Near:     Physical Exam  Constitutional: He appears well-developed and well-nourished. No distress.  HENT:  Head: Normocephalic and atraumatic.  Right Ear: External ear normal.  Left Ear: External ear normal.  Mouth/Throat: Oropharynx is clear and moist.  Mild soft tissue swelling over the right cheek diffusely causing some face asymmetry on examination.  No tenderness or induration.  No wound  Eyes: Pupils are equal, round, and reactive to  light. Conjunctivae are normal.  Neck: Normal range of motion.  Cardiovascular: Normal rate, regular rhythm and normal heart sounds.  Pulmonary/Chest: Effort normal and breath sounds normal. No respiratory distress.  Abdominal: Soft. He exhibits no distension.  Musculoskeletal: Normal range of motion. He exhibits no edema.  Neurological: He is alert.  Skin: Skin is warm and dry.     UC Treatments / Results  Labs (all labs ordered are listed, but only abnormal results are displayed) Labs Reviewed - No data to display  EKG None  Radiology No results found.  Procedures Procedures (including critical care time)  Medications Ordered in UC Medications - No data to display  Initial Impression / Assessment and Plan / UC Course  I have reviewed the triage vital signs and the nursing notes.  Pertinent labs & imaging results that were available during my care of the patient were reviewed by me and considered in my medical decision making (see chart for details).     [place] allergic reaction to the bite.  Uncertain why he is having a delayed hypersensitivity reaction.  Will treat with steroids and have him come back but does not improve Final Clinical Impressions(s) / UC Diagnoses   Final diagnoses:  Allergic reaction, initial encounter     Discharge Instructions     Take the prednisone 2 x a day Take the antibiotic 3 x a day Expect improvement in 12-24-hours Return promptly if worse - increased swelling, pain or fever    ED Prescriptions    Medication Sig Dispense Auth. Provider   predniSONE (DELTASONE) 20 MG tablet Take 1 tablet (20 mg total) by mouth 2 (two) times daily with a meal. 10 tablet Raylene Everts, MD   cephALEXin (KEFLEX) 500 MG capsule  (Status: Discontinued) Take 1 capsule (500 mg total) by mouth 4 (four) times daily. 20 capsule Raylene Everts, MD   cephALEXin (KEFLEX) 500 MG capsule Take 1 capsule (500 mg total) by mouth 3 (three) times daily. 15  capsule Raylene Everts, MD     Controlled Substance Prescriptions [place] Controlled Substance Registry consulted? Not Applicable   Raylene Everts, MD 09/08/18 2204

## 2018-09-12 DIAGNOSIS — J209 Acute bronchitis, unspecified: Secondary | ICD-10-CM | POA: Diagnosis not present

## 2018-12-31 DIAGNOSIS — H2513 Age-related nuclear cataract, bilateral: Secondary | ICD-10-CM | POA: Diagnosis not present

## 2019-01-04 DIAGNOSIS — H20811 Fuchs' heterochromic cyclitis, right eye: Secondary | ICD-10-CM | POA: Diagnosis not present

## 2019-01-24 DIAGNOSIS — H2513 Age-related nuclear cataract, bilateral: Secondary | ICD-10-CM | POA: Diagnosis not present

## 2019-01-24 DIAGNOSIS — H25811 Combined forms of age-related cataract, right eye: Secondary | ICD-10-CM | POA: Diagnosis not present

## 2019-01-24 DIAGNOSIS — Z01818 Encounter for other preprocedural examination: Secondary | ICD-10-CM | POA: Diagnosis not present

## 2019-01-24 DIAGNOSIS — H2511 Age-related nuclear cataract, right eye: Secondary | ICD-10-CM | POA: Diagnosis not present

## 2019-02-26 DIAGNOSIS — K5733 Diverticulitis of large intestine without perforation or abscess with bleeding: Secondary | ICD-10-CM | POA: Diagnosis not present

## 2019-02-26 DIAGNOSIS — Z8719 Personal history of other diseases of the digestive system: Secondary | ICD-10-CM | POA: Diagnosis not present

## 2019-02-26 DIAGNOSIS — Z86010 Personal history of colonic polyps: Secondary | ICD-10-CM | POA: Diagnosis not present

## 2019-05-01 DIAGNOSIS — Z0000 Encounter for general adult medical examination without abnormal findings: Secondary | ICD-10-CM | POA: Diagnosis not present

## 2019-05-01 DIAGNOSIS — Z1389 Encounter for screening for other disorder: Secondary | ICD-10-CM | POA: Diagnosis not present

## 2019-05-01 DIAGNOSIS — F4321 Adjustment disorder with depressed mood: Secondary | ICD-10-CM | POA: Diagnosis not present

## 2019-05-01 DIAGNOSIS — G4730 Sleep apnea, unspecified: Secondary | ICD-10-CM | POA: Diagnosis not present

## 2019-05-01 DIAGNOSIS — Z8546 Personal history of malignant neoplasm of prostate: Secondary | ICD-10-CM | POA: Diagnosis not present

## 2019-05-01 DIAGNOSIS — E663 Overweight: Secondary | ICD-10-CM | POA: Diagnosis not present

## 2019-05-01 DIAGNOSIS — I10 Essential (primary) hypertension: Secondary | ICD-10-CM | POA: Diagnosis not present

## 2019-05-02 DIAGNOSIS — I10 Essential (primary) hypertension: Secondary | ICD-10-CM | POA: Diagnosis not present

## 2019-05-10 DIAGNOSIS — Z961 Presence of intraocular lens: Secondary | ICD-10-CM | POA: Diagnosis not present

## 2019-05-10 DIAGNOSIS — H25812 Combined forms of age-related cataract, left eye: Secondary | ICD-10-CM | POA: Diagnosis not present

## 2019-05-10 DIAGNOSIS — H469 Unspecified optic neuritis: Secondary | ICD-10-CM | POA: Diagnosis not present

## 2019-06-04 DIAGNOSIS — H2512 Age-related nuclear cataract, left eye: Secondary | ICD-10-CM | POA: Diagnosis not present

## 2019-06-04 DIAGNOSIS — H25812 Combined forms of age-related cataract, left eye: Secondary | ICD-10-CM | POA: Diagnosis not present

## 2019-06-04 DIAGNOSIS — H2513 Age-related nuclear cataract, bilateral: Secondary | ICD-10-CM | POA: Diagnosis not present

## 2019-08-09 ENCOUNTER — Ambulatory Visit (INDEPENDENT_AMBULATORY_CARE_PROVIDER_SITE_OTHER)
Admission: EM | Admit: 2019-08-09 | Discharge: 2019-08-09 | Disposition: A | Discharge status: Home / Self Care | Payer: Medicare Other | Source: Home / Self Care | Attending: Family Medicine | Admitting: Family Medicine

## 2019-08-09 ENCOUNTER — Ambulatory Visit (INDEPENDENT_AMBULATORY_CARE_PROVIDER_SITE_OTHER): Payer: Medicare Other

## 2019-08-09 ENCOUNTER — Encounter (HOSPITAL_COMMUNITY): Payer: Self-pay

## 2019-08-09 ENCOUNTER — Other Ambulatory Visit: Payer: Self-pay

## 2019-08-09 DIAGNOSIS — R0602 Shortness of breath: Secondary | ICD-10-CM

## 2019-08-09 DIAGNOSIS — K5730 Diverticulosis of large intestine without perforation or abscess without bleeding: Secondary | ICD-10-CM | POA: Diagnosis not present

## 2019-08-09 DIAGNOSIS — I10 Essential (primary) hypertension: Secondary | ICD-10-CM | POA: Insufficient documentation

## 2019-08-09 DIAGNOSIS — R0789 Other chest pain: Secondary | ICD-10-CM | POA: Insufficient documentation

## 2019-08-09 DIAGNOSIS — Z87891 Personal history of nicotine dependence: Secondary | ICD-10-CM | POA: Insufficient documentation

## 2019-08-09 DIAGNOSIS — K643 Fourth degree hemorrhoids: Secondary | ICD-10-CM | POA: Diagnosis not present

## 2019-08-09 DIAGNOSIS — N179 Acute kidney failure, unspecified: Secondary | ICD-10-CM | POA: Diagnosis not present

## 2019-08-09 DIAGNOSIS — D649 Anemia, unspecified: Secondary | ICD-10-CM | POA: Insufficient documentation

## 2019-08-09 DIAGNOSIS — Z885 Allergy status to narcotic agent status: Secondary | ICD-10-CM | POA: Insufficient documentation

## 2019-08-09 DIAGNOSIS — Z20828 Contact with and (suspected) exposure to other viral communicable diseases: Secondary | ICD-10-CM | POA: Insufficient documentation

## 2019-08-09 DIAGNOSIS — R9431 Abnormal electrocardiogram [ECG] [EKG]: Secondary | ICD-10-CM | POA: Insufficient documentation

## 2019-08-09 DIAGNOSIS — R079 Chest pain, unspecified: Secondary | ICD-10-CM | POA: Diagnosis not present

## 2019-08-09 DIAGNOSIS — N289 Disorder of kidney and ureter, unspecified: Secondary | ICD-10-CM

## 2019-08-09 DIAGNOSIS — K921 Melena: Secondary | ICD-10-CM | POA: Diagnosis not present

## 2019-08-09 DIAGNOSIS — Z8546 Personal history of malignant neoplasm of prostate: Secondary | ICD-10-CM | POA: Insufficient documentation

## 2019-08-09 DIAGNOSIS — D62 Acute posthemorrhagic anemia: Secondary | ICD-10-CM | POA: Diagnosis not present

## 2019-08-09 LAB — CBC
HCT: 25.9 % — ABNORMAL LOW (ref 39.0–52.0)
Hemoglobin: 8.7 g/dL — ABNORMAL LOW (ref 13.0–17.0)
MCH: 28.6 pg (ref 26.0–34.0)
MCHC: 33.6 g/dL (ref 30.0–36.0)
MCV: 85.2 fL (ref 80.0–100.0)
Platelets: 229 10*3/uL (ref 150–400)
RBC: 3.04 MIL/uL — ABNORMAL LOW (ref 4.22–5.81)
RDW: 16 % — ABNORMAL HIGH (ref 11.5–15.5)
WBC: 7.7 10*3/uL (ref 4.0–10.5)
nRBC: 0 % (ref 0.0–0.2)

## 2019-08-09 LAB — BASIC METABOLIC PANEL
Anion gap: 12 (ref 5–15)
BUN: 17 mg/dL (ref 8–23)
CO2: 22 mmol/L (ref 22–32)
Calcium: 9.2 mg/dL (ref 8.9–10.3)
Chloride: 103 mmol/L (ref 98–111)
Creatinine, Ser: 1.36 mg/dL — ABNORMAL HIGH (ref 0.61–1.24)
GFR calc Af Amer: 60 mL/min — ABNORMAL LOW (ref >60)
GFR calc non Af Amer: 52 mL/min — ABNORMAL LOW (ref >60)
Glucose, Bld: 113 mg/dL — ABNORMAL HIGH (ref 70–99)
Potassium: 3.5 mmol/L (ref 3.5–5.1)
Sodium: 137 mmol/L (ref 135–145)

## 2019-08-09 NOTE — ED Provider Notes (Signed)
[place]   SN:[id number] 08/09/19 Arrival Time: 0906  ASSESSMENT & PLAN:  1. Shortness of breath   2. Atypical chest pain   3. Anemia, unspecified type   4. Renal insufficiency     Discussed lab abnormalities discovered this morning. VSS. COVID-19 testing sent. To self-quarantine until results are available. No known exposures. Afebrile.  ECG: Performed today and interpreted by me: normal sinus rhythm, non-specific ST changes. No STEMI. No signs of ACS.  I have personally viewed the imaging studies ordered this visit. CXR appears normal.    Discharge Instructions     If your Covid-19 test is positive, you will get a phone call from [facility] regarding your results. If your Covid-19 test is negative, you will NOT get a phone call from [facility] with your results. You may view your results on MyChart. If you do not have a MyChart account, sign up instructions are in your discharge papers.  You have been seen at the [facility] today for shortness of breath and chest pain. Your evaluation today was not suggestive of any emergent condition requiring medical intervention at this time. Your ECG (heart tracing) did not show any obvious or worrisome changes and your chest x-ray was normal. However, it is important to acknowledge that some medical problems make take more time to appear. Therefore, it's very important that you pay attention to any new symptoms or worsening of your current condition.  Please proceed directly to the Emergency Department immediately should you feel worse in any way or have any of the following symptoms: increasing or different chest pain and/or shortness of breath, pain that spreads to your arm, neck, jaw, back or abdomen, shortness of breath, or nausea and vomiting.  I recommend that you schedule follow up with your primary doctor to discuss the low hemoglobin (anemia) discovered today as well as to recheck your blood work to follow  your kidney function.    Follow-up Information    Schedule an appointment as soon as possible for a visit  with Seward Carol, MD.   Specialty: Internal Medicine Contact information: [address] [place] [address] [place].   Specialty: Emergency Medicine Why: If symptoms worsen in any way. Contact information: [address] [id number] [place] [phone]         Reviewed expectations re: course of current medical issues. Questions answered. Outlined signs and symptoms indicating need for more acute intervention. Patient verbalized understanding. After Visit Summary given.   SUBJECTIVE: History from: patient. Maurice Vang is a 73 y.o. male who presents with complaint of intermittent SOB and non-radiating "pulling" chest discomfort; mainly with exertion. Reports 2-3 days of diarrhea early this week; now resolved. About 2 days ago began to notice mild SOB with exertion; relieved with rest. Sometimes with associated "pulling feeling" in his chest. Without associated n/v/diaphoresis. No back or abdominal pain. No worsening of symptoms since first noticing. Injury or recent strenuous activity: none. When present, rates as 2/10 in intensity when present. Typical duration of symptoms when present: "until I rest". Denies: irregular heart beat, lower extremity edema, near-syncope, orthopnea, palpitations, paroxysmal nocturnal dyspnea and syncope. Questions mild fatigue. Recent illnesses: none. Fever: absent. Ambulatory without assistance. Self/OTC treatment: none. No new medications. History of similar: no. Illicit drug use: none.  Social History   Tobacco Use  Smoking Status Former Smoker  Smokeless  Tobacco Never Used   Social History   Substance and Sexual Activity  Alcohol Use No    ROS: As per HPI. All other systems negative.    OBJECTIVE:  Vitals:   08/09/19 0919  BP: 127/74  Pulse: 84  Resp: 19  Temp: 98.5 F (36.9 C)  TempSrc: Oral  SpO2: 100%    General appearance: alert, oriented, no acute distress Eyes: PERRLA; EOMI; conjunctivae normal HENT: normocephalic; atraumatic Neck: supple with FROM Lungs: without labored respirations; CTAB Heart: regular rate and rhythm without murmer Chest Wall: without tenderness to palpation Abdomen: soft, non-tender; bowel sounds normal; no masses or organomegaly; no guarding or rebound tenderness Extremities: without edema; without calf swelling or tenderness; symmetrical without gross deformities Skin: warm and dry; without rash or lesions Psychological: alert and cooperative; normal mood and affect  Labs: Results for orders placed or performed during the hospital encounter of 08/09/19  CBC  Result Value Ref Range   WBC 7.7 4.0 - 10.5 K/uL   RBC 3.04 (L) 4.22 - 5.81 MIL/uL   Hemoglobin 8.7 (L) 13.0 - 17.0 g/dL   HCT 25.9 (L) 39.0 - 52.0 %   MCV 85.2 80.0 - 100.0 fL   MCH 28.6 26.0 - 34.0 pg   MCHC 33.6 30.0 - 36.0 g/dL   RDW 16.0 (H) 11.5 - 15.5 %   Platelets 229 150 - 400 K/uL   nRBC 0.0 0.0 - 0.2 %  Basic metabolic panel  Result Value Ref Range   Sodium 137 135 - 145 mmol/L   Potassium 3.5 3.5 - 5.1 mmol/L   Chloride 103 98 - 111 mmol/L   CO2 22 22 - 32 mmol/L   Glucose, Bld 113 (H) 70 - 99 mg/dL   BUN 17 8 - 23 mg/dL   Creatinine, Ser 1.36 (H) 0.61 - 1.24 mg/dL   Calcium 9.2 8.9 - 10.3 mg/dL   GFR calc non Af Amer 52 (L) >60 mL/min   GFR calc Af Amer 60 (L) >60 mL/min   Anion gap 12 5 - 15    Imaging: Dg Chest 2 View  Result Date: 08/09/2019 CLINICAL DATA:  Shortness of breath EXAM: CHEST - 2 VIEW COMPARISON:  None. FINDINGS: The heart size and mediastinal contours are within normal limits. Both lungs are clear. The visualized skeletal structures are unremarkable. IMPRESSION: No acute abnormality of the lungs. Electronically Signed   By:  Eddie Candle M.D.   On: 08/09/2019 10:21     Allergies  Allergen Reactions   Codeine Nausea And Vomiting    Past Medical History:  Diagnosis Date   Cancer ([place])    prostate   Hypertension    Social History   Socioeconomic History   Marital status: Married    Spouse name: Not on file   Number of children: Not on file   Years of education: Not on file   Highest education level: Not on file  Occupational History   Not on file  Social Needs   Financial resource strain: Not on file   Food insecurity    Worry: Not on file    Inability: Not on file   Transportation needs    Medical: Not on file    Non-medical: Not on file  Tobacco Use   Smoking status: Former Smoker   Smokeless tobacco: Never Used  Substance and Sexual Activity   Alcohol use: No   Drug use: No   Sexual activity: Not on file  Lifestyle   Physical activity  Days per week: Not on file    Minutes per session: Not on file   Stress: Not on file  Relationships   Social connections    Talks on phone: Not on file    Gets together: Not on file    Attends religious service: Not on file    Active member of club or organization: Not on file    Attends meetings of clubs or organizations: Not on file    Relationship status: Not on file   Intimate partner violence    Fear of current or ex partner: Not on file    Emotionally abused: Not on file    Physically abused: Not on file    Forced sexual activity: Not on file  Other Topics Concern   Not on file  Social History Narrative   Not on file   Family History  Problem Relation Age of Onset   Stroke Mother    Stroke Father    Past Surgical History:  Procedure Laterality Date   prostate cancer     [place]       Vanessa Kick, MD 08/09/19 1137

## 2019-08-09 NOTE — ED Triage Notes (Signed)
Patient presents to Urgent Care with complaints of chest pain for 3-4 days, worse this morning and shortness of breath. Patient reports he has hx of htn, has been taking meds regularly but has not taken them yet today.

## 2019-08-09 NOTE — Discharge Instructions (Addendum)
If your Covid-19 test is positive, you will get a phone call from [facility] regarding your results. If your Covid-19 test is negative, you will NOT get a phone call from [facility] with your results. You may view your results on MyChart. If you do not have a MyChart account, sign up instructions are in your discharge papers.  You have been seen at the [facility] today for shortness of breath and chest pain. Your evaluation today was not suggestive of any emergent condition requiring medical intervention at this time. Your ECG (heart tracing) did not show any obvious or worrisome changes and your chest x-ray was normal. However, it is important to acknowledge that some medical problems make take more time to appear. Therefore, it's very important that you pay attention to any new symptoms or worsening of your current condition.  Please proceed directly to the Emergency Department immediately should you feel worse in any way or have any of the following symptoms: increasing or different chest pain and/or shortness of breath, pain that spreads to your arm, neck, jaw, back or abdomen, shortness of breath, or nausea and vomiting.  I recommend that you schedule follow up with your primary doctor to discuss the low hemoglobin (anemia) discovered today as well as to recheck your blood work to follow your kidney function.

## 2019-08-11 LAB — NOVEL CORONAVIRUS, NAA (HOSP ORDER, SEND-OUT TO REF LAB; TAT 18-24 HRS): SARS-CoV-2, NAA: NOT DETECTED

## 2019-08-12 ENCOUNTER — Inpatient Hospital Stay (HOSPITAL_COMMUNITY)
Admission: EM | Admit: 2019-08-12 | Discharge: 2019-08-16 | DRG: 378 | Disposition: A | Discharge status: Home / Self Care | Payer: Medicare Other | Attending: Internal Medicine | Admitting: Internal Medicine

## 2019-08-12 ENCOUNTER — Encounter (HOSPITAL_COMMUNITY): Payer: Self-pay | Admitting: Emergency Medicine

## 2019-08-12 ENCOUNTER — Emergency Department (HOSPITAL_COMMUNITY): Payer: Medicare Other

## 2019-08-12 ENCOUNTER — Other Ambulatory Visit: Payer: Self-pay

## 2019-08-12 DIAGNOSIS — R195 Other fecal abnormalities: Secondary | ICD-10-CM

## 2019-08-12 DIAGNOSIS — Z7982 Long term (current) use of aspirin: Secondary | ICD-10-CM

## 2019-08-12 DIAGNOSIS — K643 Fourth degree hemorrhoids: Secondary | ICD-10-CM | POA: Diagnosis present

## 2019-08-12 DIAGNOSIS — I10 Essential (primary) hypertension: Secondary | ICD-10-CM | POA: Diagnosis present

## 2019-08-12 DIAGNOSIS — K5730 Diverticulosis of large intestine without perforation or abscess without bleeding: Secondary | ICD-10-CM | POA: Diagnosis present

## 2019-08-12 DIAGNOSIS — K2970 Gastritis, unspecified, without bleeding: Secondary | ICD-10-CM | POA: Diagnosis present

## 2019-08-12 DIAGNOSIS — E669 Obesity, unspecified: Secondary | ICD-10-CM | POA: Diagnosis present

## 2019-08-12 DIAGNOSIS — D62 Acute posthemorrhagic anemia: Secondary | ICD-10-CM | POA: Diagnosis present

## 2019-08-12 DIAGNOSIS — Z87891 Personal history of nicotine dependence: Secondary | ICD-10-CM | POA: Diagnosis not present

## 2019-08-12 DIAGNOSIS — Z9049 Acquired absence of other specified parts of digestive tract: Secondary | ICD-10-CM | POA: Diagnosis not present

## 2019-08-12 DIAGNOSIS — K921 Melena: Principal | ICD-10-CM | POA: Diagnosis present

## 2019-08-12 DIAGNOSIS — Z8546 Personal history of malignant neoplasm of prostate: Secondary | ICD-10-CM

## 2019-08-12 DIAGNOSIS — Z885 Allergy status to narcotic agent status: Secondary | ICD-10-CM

## 2019-08-12 DIAGNOSIS — Z79899 Other long term (current) drug therapy: Secondary | ICD-10-CM

## 2019-08-12 DIAGNOSIS — R0602 Shortness of breath: Secondary | ICD-10-CM | POA: Diagnosis not present

## 2019-08-12 DIAGNOSIS — Z20828 Contact with and (suspected) exposure to other viral communicable diseases: Secondary | ICD-10-CM | POA: Diagnosis present

## 2019-08-12 DIAGNOSIS — R197 Diarrhea, unspecified: Secondary | ICD-10-CM | POA: Diagnosis not present

## 2019-08-12 DIAGNOSIS — Z6830 Body mass index (BMI) 30.0-30.9, adult: Secondary | ICD-10-CM

## 2019-08-12 DIAGNOSIS — K449 Diaphragmatic hernia without obstruction or gangrene: Secondary | ICD-10-CM | POA: Diagnosis present

## 2019-08-12 DIAGNOSIS — R079 Chest pain, unspecified: Secondary | ICD-10-CM

## 2019-08-12 DIAGNOSIS — N179 Acute kidney failure, unspecified: Secondary | ICD-10-CM | POA: Diagnosis present

## 2019-08-12 DIAGNOSIS — D649 Anemia, unspecified: Secondary | ICD-10-CM | POA: Diagnosis present

## 2019-08-12 DIAGNOSIS — K922 Gastrointestinal hemorrhage, unspecified: Secondary | ICD-10-CM | POA: Diagnosis not present

## 2019-08-12 DIAGNOSIS — K625 Hemorrhage of anus and rectum: Secondary | ICD-10-CM | POA: Diagnosis not present

## 2019-08-12 DIAGNOSIS — Z980 Intestinal bypass and anastomosis status: Secondary | ICD-10-CM | POA: Diagnosis not present

## 2019-08-12 DIAGNOSIS — K2971 Gastritis, unspecified, with bleeding: Secondary | ICD-10-CM | POA: Diagnosis not present

## 2019-08-12 DIAGNOSIS — K2950 Unspecified chronic gastritis without bleeding: Secondary | ICD-10-CM | POA: Diagnosis not present

## 2019-08-12 LAB — POC OCCULT BLOOD, ED: Fecal Occult Bld: POSITIVE — AB

## 2019-08-12 LAB — CBC
HCT: 26.6 % — ABNORMAL LOW (ref 39.0–52.0)
HCT: 27.1 % — ABNORMAL LOW (ref 39.0–52.0)
Hemoglobin: 8.4 g/dL — ABNORMAL LOW (ref 13.0–17.0)
Hemoglobin: 8.5 g/dL — ABNORMAL LOW (ref 13.0–17.0)
MCH: 27.8 pg (ref 26.0–34.0)
MCH: 27.5 pg (ref 26.0–34.0)
MCHC: 31.6 g/dL (ref 30.0–36.0)
MCHC: 31.4 g/dL (ref 30.0–36.0)
MCV: 88.1 fL (ref 80.0–100.0)
MCV: 87.7 fL (ref 80.0–100.0)
Platelets: 364 10*3/uL (ref 150–400)
Platelets: 375 10*3/uL (ref 150–400)
RBC: 3.02 MIL/uL — ABNORMAL LOW (ref 4.22–5.81)
RBC: 3.09 MIL/uL — ABNORMAL LOW (ref 4.22–5.81)
RDW: 15.7 % — ABNORMAL HIGH (ref 11.5–15.5)
RDW: 15.8 % — ABNORMAL HIGH (ref 11.5–15.5)
WBC: 7.4 10*3/uL (ref 4.0–10.5)
WBC: 8.5 10*3/uL (ref 4.0–10.5)
nRBC: 0 % (ref 0.0–0.2)
nRBC: 0 % (ref 0.0–0.2)

## 2019-08-12 LAB — URINALYSIS, ROUTINE W REFLEX MICROSCOPIC
Bilirubin Urine: NEGATIVE
Glucose, UA: NEGATIVE mg/dL
Hgb urine dipstick: NEGATIVE
Ketones, ur: NEGATIVE mg/dL
Leukocytes,Ua: NEGATIVE
Nitrite: NEGATIVE
Protein, ur: NEGATIVE mg/dL
Specific Gravity, Urine: 1.018 (ref 1.005–1.030)
pH: 5 (ref 5.0–8.0)

## 2019-08-12 LAB — BASIC METABOLIC PANEL
Anion gap: 12 (ref 5–15)
BUN: 20 mg/dL (ref 8–23)
CO2: 22 mmol/L (ref 22–32)
Calcium: 9.5 mg/dL (ref 8.9–10.3)
Chloride: 102 mmol/L (ref 98–111)
Creatinine, Ser: 1.41 mg/dL — ABNORMAL HIGH (ref 0.61–1.24)
GFR calc Af Amer: 57 mL/min — ABNORMAL LOW (ref >60)
GFR calc non Af Amer: 49 mL/min — ABNORMAL LOW (ref >60)
Glucose, Bld: 123 mg/dL — ABNORMAL HIGH (ref 70–99)
Potassium: 3.5 mmol/L (ref 3.5–5.1)
Sodium: 136 mmol/L (ref 135–145)

## 2019-08-12 LAB — TROPONIN I (HIGH SENSITIVITY)
Troponin I (High Sensitivity): 6 ng/L (ref <18)
Troponin I (High Sensitivity): 4 ng/L (ref <18)

## 2019-08-12 LAB — DIFFERENTIAL
Abs Immature Granulocytes: 0.05 10*3/uL (ref 0.00–0.07)
Basophils Absolute: 0 10*3/uL (ref 0.0–0.1)
Basophils Relative: 0 %
Eosinophils Absolute: 0 10*3/uL (ref 0.0–0.5)
Eosinophils Relative: 0 %
Immature Granulocytes: 1 %
Lymphocytes Relative: 16 %
Lymphs Abs: 1.4 10*3/uL (ref 0.7–4.0)
Monocytes Absolute: 0.8 10*3/uL (ref 0.1–1.0)
Monocytes Relative: 10 %
Neutro Abs: 6.2 10*3/uL (ref 1.7–7.7)
Neutrophils Relative %: 73 %

## 2019-08-12 LAB — APTT: aPTT: 26 seconds (ref 24–36)

## 2019-08-12 LAB — FERRITIN: Ferritin: 8 ng/mL — ABNORMAL LOW (ref 24–336)

## 2019-08-12 LAB — IRON AND TIBC
Iron: 12 ug/dL — ABNORMAL LOW (ref 45–182)
Saturation Ratios: 3 % — ABNORMAL LOW (ref 17.9–39.5)
TIBC: 416 ug/dL (ref 250–450)
UIBC: 404 ug/dL

## 2019-08-12 LAB — SARS CORONAVIRUS 2 (TAT 6-24 HRS): SARS Coronavirus 2: NEGATIVE

## 2019-08-12 LAB — PROTIME-INR
INR: 1.1 (ref 0.8–1.2)
Prothrombin Time: 13.6 seconds (ref 11.4–15.2)

## 2019-08-12 LAB — D-DIMER, QUANTITATIVE: D-Dimer, Quant: 0.46 ug/mL-FEU (ref 0.00–0.50)

## 2019-08-12 MED ORDER — SODIUM CHLORIDE 0.9 % IV BOLUS
1000.0000 mL | Freq: Once | INTRAVENOUS | Status: AC
  Administered 2019-08-12: 1000 mL via INTRAVENOUS

## 2019-08-12 MED ORDER — SODIUM CHLORIDE 0.9% FLUSH: 3.0000 mL | Freq: Once | INTRAVENOUS | Status: DC

## 2019-08-12 MED ORDER — ACETAMINOPHEN 650 MG RE SUPP: 650.0000 mg | Freq: Four times a day (QID) | RECTAL | Status: DC | PRN

## 2019-08-12 MED ORDER — HYDRALAZINE HCL 20 MG/ML IJ SOLN: 10.0000 mg | Freq: Four times a day (QID) | INTRAMUSCULAR | Status: DC | PRN

## 2019-08-12 MED ORDER — ONDANSETRON HCL 4 MG PO TABS: 4.0000 mg | ORAL_TABLET | Freq: Four times a day (QID) | ORAL | Status: DC | PRN

## 2019-08-12 MED ORDER — ONDANSETRON HCL 4 MG/2ML IJ SOLN: 4.0000 mg | Freq: Four times a day (QID) | INTRAMUSCULAR | Status: DC | PRN

## 2019-08-12 MED ORDER — RAMIPRIL 5 MG PO CAPS: 5.0000 mg | ORAL_CAPSULE | Freq: Every day | ORAL | Status: DC

## 2019-08-12 MED ORDER — HYDROCHLOROTHIAZIDE 25 MG PO TABS: 25.0000 mg | ORAL_TABLET | Freq: Every day | ORAL | Status: DC

## 2019-08-12 MED ORDER — SODIUM CHLORIDE 0.9 % IV SOLN
INTRAVENOUS | Status: DC
  Administered 2019-08-12 – 2019-08-14 (×4): via INTRAVENOUS

## 2019-08-12 MED ORDER — PANTOPRAZOLE SODIUM 40 MG PO TBEC
40.0000 mg | DELAYED_RELEASE_TABLET | Freq: Every day | ORAL | Status: DC
  Administered 2019-08-12 – 2019-08-16 (×5): 40 mg via ORAL
  Filled 2019-08-12 (×5): qty 1

## 2019-08-12 MED ORDER — ACETAMINOPHEN 325 MG PO TABS: 650.0000 mg | ORAL_TABLET | Freq: Four times a day (QID) | ORAL | Status: DC | PRN

## 2019-08-12 NOTE — H&P (Signed)
History and Physical    Maurice Vang [id number] DOB: 03-04-1946 DOA: 08/12/2019  PCP: Seward Carol, MD  Patient coming from: Home I have personally briefly reviewed patient's old medical records in [place]  Chief Complaint: Exertional shortness of breath and chest pain since 5 days.  HPI: Maurice Vang is a 73 y.o. male with medical history significant of hypertension, prostate cancer-status post surgical intervention several years back presents to emergency department due to chest discomfort and shortness of breath since 5 days.  Reports that his symptoms are mostly with exertion.  Chest pain is 4-5 out of 10 in intensity, nonradiating, dull in nature, aggravates with walking and relieved with rest, denies association with nausea, vomiting, diaphoresis.  Reports shortness of breath usually with exertion denies association with palpitation, orthopnea, PND.  Reports bloody diarrhea since 5 days.  2 episodes per day, denies association with mucus, foul-smelling, recent use of antibiotics or travel.  Denies nausea, vomiting, epigastric pain.  He takes ibuprofen 800 mg as needed for shoulder pain and headache.  His last intake was 2 to 3 weeks ago.  Reports that he had colonoscopy 2 to 3 years ago which was negative.  Had diverticulitis 15 years ago.  Denies family history of colon cancer, decreased appetite, weight loss or night sweats.  He went to urgent care on 08/09/2019 with chest discomfort and was found to have low hemoglobin.  He was advised to follow-up with PCP.  He lives alone and independent on daily life activities.  Denies smoking, alcohol, illicit drug use.  ED Course: Patient tachycardic upon arrival.  H&H is low at 8.5/27.1-was 8.7/25.93 days ago.  Troponin x2-, d-dimer: WNL.  Fecal occult came back positive.  Chest x-ray negative.  EDP consulted GI for further evaluation and management.  Review of Systems: As per HPI otherwise negative.    Past Medical History:   Diagnosis Date  . Cancer [facility])    prostate  . Hypertension     Past Surgical History:  Procedure Laterality Date  . prostate cancer    . PROSTATE SURGERY    . SMALL INTESTINE SURGERY       reports that he has quit smoking. He has never used smokeless tobacco. He reports that he does not drink alcohol or use drugs.  Allergies  Allergen Reactions  . Codeine Nausea And Vomiting    Family History  Problem Relation Age of Onset  . Stroke Mother   . Stroke Father     Prior to Admission medications   Medication Sig Start Date End Date Taking? Authorizing Provider  aspirin 81 MG tablet Take 81 mg by mouth daily.   Yes [provider]  Cholecalciferol ([employer] VIT D 5000 HIGH-POTENCY PO) Take 1 tablet by mouth daily.   Yes [provider]  hydrochlorothiazide (HYDRODIURIL) 25 MG tablet Take 25 mg by mouth daily.   Yes [provider]  ramipril (ALTACE) 5 MG capsule Take 5 mg by mouth daily.   Yes [provider]    Physical Exam: Vitals:   08/12/19 1216 08/12/19 1400  BP: (!) 144/63 (!) 142/77  Pulse: (!) 122 98  Resp: 20 16  Temp: 98.4 F (36.9 C)   TempSrc: Oral   SpO2: 100% 100%    Constitutional: NAD, calm, comfortable Vitals:   08/12/19 1216 08/12/19 1400  BP: (!) 144/63 (!) 142/77  Pulse: (!) 122 98  Resp: 20 16  Temp: 98.4 F (36.9 C)   TempSrc: Oral  SpO2: 100% 100%   Constitutional: Alert and oriented x3, communicating well, not in acute distress.   Eyes: PERRL, lids and conjunctivae-dusky  ENMT: Mucous membranes are moist. Posterior pharynx clear of any exudate or lesions.Normal dentition.  Neck: normal, supple, no masses, no thyromegaly Respiratory: clear to auscultation bilaterally, no wheezing, no crackles. Normal respiratory effort. No accessory muscle use.  Cardiovascular: Regular rate and rhythm, no murmurs / rubs / gallops. No extremity edema. 2+ pedal pulses. No carotid bruits.  Abdomen: no tenderness, no masses  palpated. No hepatosplenomegaly. Bowel sounds positive.  Musculoskeletal: no clubbing / cyanosis. No joint deformity upper and lower extremities. Good ROM, no contractures. Normal muscle tone.  Skin: no rashes, lesions, ulcers. No induration Neurologic: CN 2-12 grossly intact. Sensation intact, DTR normal. Strength 5/5 in all 4.  Psychiatric: Normal judgment and insight. Alert and oriented x 3. Normal mood.    Labs on Admission: I have personally reviewed following labs and imaging studies  CBC: Recent Labs  Lab 08/09/19 0947 08/12/19 1227 08/12/19 1405  WBC 7.7 7.4 8.5  NEUTROABS  --   --  6.2  HGB 8.7* 8.4* 8.5*  HCT 25.9* 26.6* 27.1*  MCV 85.2 88.1 87.7  PLT 229 364 [id number]   Basic Metabolic Panel: Recent Labs  Lab 08/09/19 0947 08/12/19 1227  NA 137 136  K 3.5 3.5  CL 103 102  CO2 22 22  GLUCOSE 113* 123*  BUN 17 20  CREATININE 1.36* 1.41*  CALCIUM 9.2 9.5   GFR: CrCl cannot be calculated (Unknown ideal weight.). Liver Function Tests: No results for input(s): AST, ALT, ALKPHOS, BILITOT, PROT, ALBUMIN in the last 168 hours. No results for input(s): LIPASE, AMYLASE in the last 168 hours. No results for input(s): AMMONIA in the last 168 hours. Coagulation Profile: No results for input(s): INR, PROTIME in the last 168 hours. Cardiac Enzymes: No results for input(s): CKTOTAL, CKMB, CKMBINDEX, TROPONINI in the last 168 hours. BNP (last 3 results) No results for input(s): PROBNP in the last 8760 hours. HbA1C: No results for input(s): HGBA1C in the last 72 hours. CBG: No results for input(s): GLUCAP in the last 168 hours. Lipid Profile: No results for input(s): CHOL, HDL, LDLCALC, TRIG, CHOLHDL, LDLDIRECT in the last 72 hours. Thyroid Function Tests: No results for input(s): TSH, T4TOTAL, FREET4, T3FREE, THYROIDAB in the last 72 hours. Anemia Panel: No results for input(s): VITAMINB12, FOLATE, FERRITIN, TIBC, IRON, RETICCTPCT in the last 72 hours. Urine analysis: No  results found for: COLORURINE, APPEARANCEUR, LABSPEC, [place], GLUCOSEU, HGBUR, BILIRUBINUR, KETONESUR, PROTEINUR, UROBILINOGEN, NITRITE, LEUKOCYTESUR  Radiological Exams on Admission: Dg Chest 2 View  Result Date: 08/12/2019 CLINICAL DATA:  Chest pain and shortness of breath 1 week. EXAM: CHEST - 2 VIEW COMPARISON:  08/09/2019 FINDINGS: Lungs are adequately inflated and clear. Flattening of the hemidiaphragms on the lateral film. Cardiomediastinal silhouette and remainder of the exam is unchanged. IMPRESSION: No active cardiopulmonary disease. Electronically Signed   By: Marin Olp M.D.   On: 08/12/2019 13:20    EKG: Sinus tachycardia with premature ventricular complexes and left axis deviation.  Assessment/Plan Active Problems:   Anemia associated with acute blood loss   Hypertension   History of prostate cancer   Symptomatic anemia    Acute blood loss anemia: -Patient reports bloody diarrhea since 5 days associated with exertional chest pain and shortness of breath.  Takes over-the-counter NSAIDs on and off for headache and shoulder pain.  Hemoccult positive in the ED.  Troponin x2-.  Chest  x-ray negative.  COVID-19: Pending. -H&H is 8.5/27.1-Donot have baseline H & H in the EMR -Check iron, ferritin and TIBC level. -Start Zofran PRN for nausea and vomiting and Protonix 40 mg p.o. daily. -Monitor H&H closely. -No indication of blood transfusion at this time. -EDP consulted GI-await recommendation.  Avoid NSAIDs.  Symptomatic anemia: -Patient presented with exertional chest pain and shortness of breath. -Troponin negative x2, d-dimer: WNL.  Chest x-ray is negative. -We will monitor his H&H very closely and transfuse if indicated.  Hypertension: Stable -We will hold home dose of lisinopril and HCTZ at this time due to mild AKI -Monitor his blood pressure closely. -Hydralazine PRN.Marland Kitchen Resume his home meds once kidney function back to normal.  Mild AKI: -Likely secondary to  dehydration due to diarrhea -Received IV fluid bolus in ED.  Continue IV fluids.  Avoid nephrotoxic medication. -Monitor BMP daily.  History of prostate cancer: -Status post surgery long time ago. -Patient denies any urinary symptoms.  DVT prophylaxis: TED/SCD-no Lovenox due to history of bloody diarrhea  code Status: Full code Family Communication: None present at bedside.  Plan of care discussed with patient in length and he verbalized understanding and agreed with it. Disposition Plan: Likely home in 2 to 3 days  consults called: GI by EDP Admission status: Inpatient  Mckinley Jewel MD [facility] Pager [phone]  If 7PM-7AM, please contact night-coverage [email or web address] Password [email or web address]  08/12/2019, 3:27 PM

## 2019-08-12 NOTE — ED Notes (Signed)
ED TO INPATIENT HANDOFF REPORT  ED Nurse Name and Phone #:   S Name/Age/Gender Maurice Vang 73 y.o. male Room/Bed: 036C/036C  Code Status   Code Status: Full Code  Home/SNF/Other Home Patient oriented to: self, place, time and situation Is this baseline? No   Triage Complete: Triage complete  Chief Complaint Chest pain; sob x4 days  Triage Note PT here for 1 week for exertional chest pain with shortness of breath with ambulating. Had covid and cardiac work up that were negative. PT reports diarrhea 1 week ago.    Allergies Allergies  Allergen Reactions  . Codeine Nausea And Vomiting    Level of Care/Admitting Diagnosis ED Disposition    ED Disposition Condition [facility] Area: [place] [[id number]]  Level of Care: Med-Surg [16]  Covid Evaluation: Asymptomatic Screening Protocol (No Symptoms)  Diagnosis: Anemia associated with acute blood loss [id number]  Admitting Physician: Mckinley Jewel [id number]  Attending Physician: Mckinley Jewel [phone]  Estimated length of stay: past midnight tomorrow  Certification:: I certify this patient will need inpatient services for at least 2 midnights  PT Class (Do Not Modify): Inpatient [101]  PT Acc Code (Do Not Modify): Private [1]       B Medical/Surgery History Past Medical History:  Diagnosis Date  . Cancer [facility])    prostate  . Hypertension    Past Surgical History:  Procedure Laterality Date  . prostate cancer    . PROSTATE SURGERY    . SMALL INTESTINE SURGERY       A IV Location/Drains/Wounds Patient Lines/Drains/Airways Status   Active Line/Drains/Airways    Name:   Placement date:   Placement time:   Site:   Days:   Peripheral IV 08/12/19 Left Antecubital   08/12/19    1415    Antecubital   less than 1          Intake/Output Last 24 hours No intake or output data in the 24 hours ending 08/12/19 1852  Labs/Imaging Results for orders placed or performed during  the hospital encounter of 08/12/19 (from the past 48 hour(s))  Basic metabolic panel     Status: Abnormal   Collection Time: 08/12/19 12:27 PM  Result Value Ref Range   Sodium 136 135 - 145 mmol/L   Potassium 3.5 3.5 - 5.1 mmol/L   Chloride 102 98 - 111 mmol/L   CO2 22 22 - 32 mmol/L   Glucose, Bld 123 (H) 70 - 99 mg/dL   BUN 20 8 - 23 mg/dL   Creatinine, Ser 1.41 (H) 0.61 - 1.24 mg/dL   Calcium 9.5 8.9 - 10.3 mg/dL   GFR calc non Af Amer 49 (L) >60 mL/min   GFR calc Af Amer 57 (L) >60 mL/min   Anion gap 12 5 - 15    Comment: Performed at [facility] [address] [place], [place] [address]  CBC     Status: Abnormal   Collection Time: 08/12/19 12:27 PM  Result Value Ref Range   WBC 7.4 4.0 - 10.5 K/uL   RBC 3.02 (L) 4.22 - 5.81 MIL/uL   Hemoglobin 8.4 (L) 13.0 - 17.0 g/dL   HCT 26.6 (L) 39.0 - 52.0 %   MCV 88.1 80.0 - 100.0 fL   MCH 27.8 26.0 - 34.0 pg   MCHC 31.6 30.0 - 36.0 g/dL   RDW 15.7 (H) 11.5 - 15.5 %   Platelets 364 150 - 400 K/uL  nRBC 0.0 0.0 - 0.2 %    Comment: Performed at [facility], [place] [address], [place], [place] [address]  Troponin I (High Sensitivity)     Status: None   Collection Time: 08/12/19 12:27 PM  Result Value Ref Range   Troponin I (High Sensitivity) 6 <18 ng/L    Comment: (NOTE) Elevated high sensitivity troponin I (hsTnI) values and significant  changes across serial measurements may suggest ACS but many other  chronic and acute conditions are known to elevate hsTnI results.  Refer to the "Links" section for chest pain algorithms and additional  guidance. Performed at [facility], [place] [address] [place], [place] [address]   POC occult blood, ED     Status: Abnormal   Collection Time: 08/12/19  1:54 PM  Result Value Ref Range   Fecal Occult Bld POSITIVE (A) NEGATIVE  Troponin I (High Sensitivity)     Status: None   Collection Time: 08/12/19  2:05 PM  Result Value Ref Range   Troponin I (High Sensitivity) 4 <18  ng/L    Comment: (NOTE) Elevated high sensitivity troponin I (hsTnI) values and significant  changes across serial measurements may suggest ACS but many other  chronic and acute conditions are known to elevate hsTnI results.  Refer to the "Links" section for chest pain algorithms and additional  guidance. Performed at [facility], [place] [address] [place], [place] [address]   D-dimer, quantitative (not at [facility])     Status: None   Collection Time: 08/12/19  2:05 PM  Result Value Ref Range   D-Dimer, Quant 0.46 0.00 - 0.50 ug/mL-FEU    Comment: (NOTE) At the manufacturer cut-off of 0.50 ug/mL FEU, this assay has been documented to exclude PE with a sensitivity and negative predictive value of 97 to 99%.  At this time, this assay has not been approved by the FDA to exclude DVT/VTE. Results should be correlated with clinical presentation. Performed at [facility], [place] [address] [place], [place] [address]   Differential     Status: None   Collection Time: 08/12/19  2:05 PM  Result Value Ref Range   Neutrophils Relative % 73 %   Neutro Abs 6.2 1.7 - 7.7 K/uL   Lymphocytes Relative 16 %   Lymphs Abs 1.4 0.7 - 4.0 K/uL   Monocytes Relative 10 %   Monocytes Absolute 0.8 0.1 - 1.0 K/uL   Eosinophils Relative 0 %   Eosinophils Absolute 0.0 0.0 - 0.5 K/uL   Basophils Relative 0 %   Basophils Absolute 0.0 0.0 - 0.1 K/uL   Immature Granulocytes 1 %   Abs Immature Granulocytes 0.05 0.00 - 0.07 K/uL    Comment: Performed at [facility] [address], [place], [place] [address]  CBC     Status: Abnormal   Collection Time: 08/12/19  2:05 PM  Result Value Ref Range   WBC 8.5 4.0 - 10.5 K/uL   RBC 3.09 (L) 4.22 - 5.81 MIL/uL   Hemoglobin 8.5 (L) 13.0 - 17.0 g/dL   HCT 27.1 (L) 39.0 - 52.0 %   MCV 87.7 80.0 - 100.0 fL   MCH 27.5 26.0 - 34.0 pg   MCHC 31.4 30.0 - 36.0 g/dL   RDW 15.8 (H) 11.5 - 15.5 %   Platelets 375 150 - 400 K/uL   nRBC 0.0 0.0 - 0.2 %     Comment: Performed at [facility]  [address] [place], [place] [address]  Protime-INR     Status: None   Collection Time: 08/12/19  2:05 PM  Result Value Ref Range   Prothrombin Time 13.6 11.4 - 15.2 seconds   INR 1.1 0.8 - 1.2    Comment: (NOTE) INR goal varies based on device and disease states. Performed at [facility], [place] [address] [place], [place] [address]   APTT     Status: None   Collection Time: 08/12/19  2:05 PM  Result Value Ref Range   aPTT 26 24 - 36 seconds    Comment: Performed at [facility] [address], [place], [place] [address]  Urinalysis, Routine w reflex microscopic     Status: None   Collection Time: 08/12/19  4:20 PM  Result Value Ref Range   Color, Urine YELLOW YELLOW   APPearance CLEAR CLEAR   Specific Gravity, Urine 1.018 1.005 - 1.030   pH 5.0 5.0 - 8.0   Glucose, UA NEGATIVE NEGATIVE mg/dL   Hgb urine dipstick NEGATIVE NEGATIVE   Bilirubin Urine NEGATIVE NEGATIVE   Ketones, ur NEGATIVE NEGATIVE mg/dL   Protein, ur NEGATIVE NEGATIVE mg/dL   Nitrite NEGATIVE NEGATIVE   Leukocytes,Ua NEGATIVE NEGATIVE    Comment: Performed at [facility] [address], [place], [place] [address]  Iron and TIBC     Status: Abnormal   Collection Time: 08/12/19  4:25 PM  Result Value Ref Range   Iron 12 (L) 45 - 182 ug/dL   TIBC 416 250 - 450 ug/dL   Saturation Ratios 3 (L) 17.9 - 39.5 %   UIBC 404 ug/dL    Comment: Performed at [facility], [place] [address], [place], [place] [address]  Ferritin     Status: Abnormal   Collection Time: 08/12/19  4:25 PM  Result Value Ref Range   Ferritin 8 (L) 24 - 336 ng/mL    Comment: Performed at [facility], [place] [address] [place], [place] [address]   Dg Chest 2 View  Result Date: 08/12/2019 CLINICAL DATA:  Chest pain and shortness of breath 1 week. EXAM: CHEST - 2 VIEW COMPARISON:  08/09/2019 FINDINGS: Lungs are adequately inflated and clear. Flattening of the  hemidiaphragms on the lateral film. Cardiomediastinal silhouette and remainder of the exam is unchanged. IMPRESSION: No active cardiopulmonary disease. Electronically Signed   By: Marin Olp M.D.   On: 08/12/2019 13:20    Pending Labs Unresulted Labs (From admission, onward)    Start     Ordered   08/13/19 [id number]  Basic metabolic panel  Tomorrow morning,   R     08/12/19 1523   08/13/19 0500  CBC  Tomorrow morning,   R     08/12/19 1523   08/12/19 1517  SARS CORONAVIRUS 2 (TAT 6-24 HRS) Nasopharyngeal Nasopharyngeal Swab  (Asymptomatic/Tier 2 Patients Labs)  Once,   STAT    Question Answer Comment  Is this test for diagnosis or screening Screening   Symptomatic for COVID-19 as defined by CDC No   Hospitalized for COVID-19 No   Admitted to ICU for COVID-19 No   Previously tested for COVID-19 Yes   Resident in a congregate (group) care setting No   Employed in healthcare setting No      08/12/19 1516          Vitals/Pain Today's Vitals   08/12/19 1216 08/12/19 1400 08/12/19 1546 08/12/19 1800  BP: (!) 144/63 (!) 142/77  119/70  Pulse: (!) 122 98    Resp: 20 16  (!) 22  Temp: 98.4 F (36.9 C)     TempSrc: Oral     SpO2: 100% 100%  97%  PainSc:   0-No pain     Isolation Precautions No active isolations  Medications Medications  sodium chloride flush (NS) 0.9 % injection 3 mL (has no administration in time range)  acetaminophen (TYLENOL) tablet 650 mg (has no administration in time range)    Or  acetaminophen (TYLENOL) suppository 650 mg (has no administration in time range)  ondansetron (ZOFRAN) tablet 4 mg (has no administration in time range)    Or  ondansetron (ZOFRAN) injection 4 mg (has no administration in time range)  pantoprazole (PROTONIX) EC tablet 40 mg (40 mg Oral Given 08/12/19 1626)  hydrALAZINE (APRESOLINE) injection 10 mg (has no administration in time range)  0.9 %  sodium chloride infusion ( Intravenous New Bag/Given 08/12/19 1626)  sodium chloride  0.9 % bolus 1,000 mL (0 mLs Intravenous Stopped 08/12/19 1625)    Mobility walks Low fall risk   Focused Assessments Cardiac Assessment Handoff:  Cardiac Rhythm: Sinus tachycardia No results found for: CKTOTAL, CKMB, CKMBINDEX, TROPONINI Lab Results  Component Value Date   DDIMER 0.46 08/12/2019   Does the Patient currently have chest pain? No     R Recommendations: See Admitting Provider Note  Report given to:   Additional Notes:

## 2019-08-12 NOTE — ED Provider Notes (Signed)
[facility] EMERGENCY DEPARTMENT Provider Note   CSN: [id number] Arrival date & time: 08/12/19  1158     History   Chief Complaint Chief Complaint  Patient presents with  . Chest Pain  . Shortness of Breath    HPI Maurice Vang is a 73 y.o. male with a history of hypertension and prostate cancer status in remission several years s/p surgical intervention who presents to the ED w/ complaints of intermittent chest discomfort for the past 5 days.  Patient states that he gets discomfort in his chest described as pressure associated with shortness of breath only with exertion, alleviated with rest. Patient states he becomes symptomatic with very minimal activity such as walking down the hall. Other than exertion/rest no other alleviating/aggravating factors. States he had a stress test a long time ago but denies known cardiac history. Denies prior catheterizations. Denies nausea, vomiting, diaphoresis,  leg pain/swelling, hemoptysis, recent surgery/trauma, recent long travel, hormone use, personal hx of cancer, or hx of DVT/PE.  He also mentions that he has had some loose bloody stools over the past 1 week, about 1-2 episodes per day, seemed to be improving, he thought this may be hemorrhoids as he has had bloody stools related to hemorrhoids several years ago. He believes he had a colonoscopy at some point but is unsure when or the results. Denies nausea, vomiting, abdominal pain, melena, or constipation. Denies lightheadedness, dizziness, or syncope. Denies recent abx or foreign travel.   Seen @ UC for similar 08/09/19- had basic labs which were notable for anemia & elevated serum creatinine, CXR which was negative, & EKG      HPI  Past Medical History:  Diagnosis Date  . Cancer [facility])    prostate  . Hypertension     There are no active problems to display for this patient.   Past Surgical History:  Procedure Laterality Date  . prostate cancer    . PROSTATE SURGERY     . SMALL INTESTINE SURGERY          Home Medications    Prior to Admission medications   Medication Sig Start Date End Date Taking? Authorizing Provider  aspirin 81 MG tablet Take 81 mg by mouth daily.    [provider]  cephALEXin (KEFLEX) 500 MG capsule Take 1 capsule (500 mg total) by mouth 3 (three) times daily. 09/05/18   Raylene Everts, MD  Cholecalciferol ([employer] VIT D 5000 HIGH-POTENCY PO) Take 1 tablet by mouth daily.    [provider]  hydrochlorothiazide (HYDRODIURIL) 25 MG tablet Take 25 mg by mouth daily.    [provider]  predniSONE (DELTASONE) 20 MG tablet Take 1 tablet (20 mg total) by mouth 2 (two) times daily with a meal. 09/05/18   Raylene Everts, MD  ramipril (ALTACE) 5 MG capsule Take 5 mg by mouth daily.    [provider]    Family History Family History  Problem Relation Age of Onset  . Stroke Mother   . Stroke Father     Social History Social History   Tobacco Use  . Smoking status: Former Research scientist (life sciences)  . Smokeless tobacco: Never Used  Substance Use Topics  . Alcohol use: No  . Drug use: No     Allergies   Codeine   Review of Systems Review of Systems  Constitutional: Negative for chills, diaphoresis and fever.  HENT: Negative for congestion, ear pain and sore throat.   Respiratory: Positive for shortness of  breath. Negative for cough.   Cardiovascular: Positive for chest pain. Negative for leg swelling.  Gastrointestinal: Positive for blood in stool and diarrhea. Negative for abdominal pain, nausea and vomiting.  Neurological: Negative for dizziness, syncope, weakness, light-headedness and numbness.  All other systems reviewed and are negative.    Physical Exam Updated Vital Signs BP (!) 144/63 (BP Location: Left Arm)   Pulse (!) 122   Temp 98.4 F (36.9 C) (Oral)   Resp 20   SpO2 100%   Physical Exam Vitals signs and nursing note reviewed. Exam conducted with a chaperone present.   Constitutional:      General: He is not in acute distress.    Appearance: He is well-developed. He is not toxic-appearing.  HENT:     Head: Normocephalic and atraumatic.  Eyes:     General:        Right eye: No discharge.        Left eye: No discharge.     Conjunctiva/sclera: Conjunctivae normal.  Neck:     Musculoskeletal: Neck supple.  Cardiovascular:     Rate and Rhythm: Regular rhythm. Tachycardia present.     Pulses:          Radial pulses are 2+ on the right side and 2+ on the left side.  Pulmonary:     Effort: Pulmonary effort is normal. No respiratory distress.     Breath sounds: Normal breath sounds. No wheezing, rhonchi or rales.  Abdominal:     General: There is no distension.     Palpations: Abdomen is soft.     Tenderness: There is no abdominal tenderness. There is no guarding or rebound.  Genitourinary:    Comments: DRE: Hematochezia present. No melena.  Skin:    General: Skin is warm and dry.     Findings: No rash.  Neurological:     Mental Status: He is alert.     Comments: Clear speech.   Psychiatric:        Behavior: Behavior normal.     ED Treatments / Results  Labs (all labs ordered are listed, but only abnormal results are displayed) Labs Reviewed  BASIC METABOLIC PANEL - Abnormal; Notable for the following components:      Result Value   Glucose, Bld 123 (*)    Creatinine, Ser 1.41 (*)    GFR calc non Af Amer 49 (*)    GFR calc Af Amer 57 (*)    All other components within normal limits  CBC - Abnormal; Notable for the following components:   RBC 3.02 (*)    Hemoglobin 8.4 (*)    HCT 26.6 (*)    RDW 15.7 (*)    All other components within normal limits  TROPONIN I (HIGH SENSITIVITY)    EKG EKG Interpretation  Date/Time:  Sunday August 12 2019 12:14:38 EDT Ventricular Rate:  122 PR Interval:  160 QRS Duration: 76 QT Interval:  340 QTC Calculation: 484 R Axis:   -41 Text Interpretation:  Sinus tachycardia with occasional  Premature ventricular complexes Left axis deviation Abnormal ECG Confirmed by Octaviano Glow [phone]) on 08/12/2019 1:55:32 PM   Radiology Dg Chest 2 View  Result Date: 08/12/2019 CLINICAL DATA:  Chest pain and shortness of breath 1 week. EXAM: CHEST - 2 VIEW COMPARISON:  08/09/2019 FINDINGS: Lungs are adequately inflated and clear. Flattening of the hemidiaphragms on the lateral film. Cardiomediastinal silhouette and remainder of the exam is unchanged. IMPRESSION: No active cardiopulmonary disease. Electronically Signed  By: Marin Olp M.D.   On: 08/12/2019 13:20    Procedures Procedures (including critical care time)  Medications Ordered in ED Medications  sodium chloride flush (NS) 0.9 % injection 3 mL (has no administration in time range)     Initial Impression / Assessment and Plan / ED Course  I have reviewed the triage vital signs and the nursing notes.  Pertinent labs & imaging results that were available during my care of the patient were reviewed by me and considered in my medical decision making (see chart for details).  Patient presents to the emergency department with complaints of exertional chest pain and dyspnea over the past 5 days also mentions some loose bloody stools for the past week.  Patient is nontoxic-appearing, no apparent distress, vitals notable for tachycardia and somewhat elevated blood pressure.  Exam was relatively benign with the exception of digital rectal exam which reveals hematochezia, no melena.  Work-up per triage reviewed:  CBC: Anemia with hemoglobin of 8.4 hematocrit of 27.1 slightly decreased from labs checked a few days ago but no significant drop.  BMP: Elevated creatinine mild increase for ma few days prior, no significant electrolyte derangement. Initial Troponin: 6 EKG: Sinus tachycardia with occasional PVCs and left axis deviation   CXR: No active cardiopulmonary disease.  Differential added on WNL D-dimer: WNL  Low risk wells,  d-dimer WNL- doubt PE.  Heart pathway score 5 with exertional chest pain/dyspnea- feel patient warrants admission. Hold off on aspirin currently without significant troponin elevation due to GI bleeding with hematochezia w/ borderline hgb/hct stable from a few days ago. Will consult hospitalist service.   Findings and plan of care discussed with supervising physician Dr. Langston Masker who has evaluated the patient & is in agreement.   14:15: CONSULT: Discussed w/ hospitalist Dr. Doristine Bosworth- accepts admission, requesting GI service consultation.    16:20; CONSULT: Discussed w/ gastroenterologist Dr. Carlean Purl- GI will consult.   Maurice Vang was evaluated in Emergency Department on 08/12/2019 for the symptoms described in the history of present illness. He/she was evaluated in the context of the global COVID-19 pandemic, which necessitated consideration that the patient might be at risk for infection with the SARS-CoV-2 virus that causes COVID-19. Institutional protocols and algorithms that pertain to the evaluation of patients at risk for COVID-19 are in a state of rapid change based on information released by regulatory bodies including the CDC and federal and state organizations. These policies and algorithms were followed during the patient's care in the ED.   Final Clinical Impressions(s) / ED Diagnoses   Final diagnoses:  Chest pain, unspecified type  Fecal occult blood test positive    ED Discharge Orders    None       Amaryllis Dyke, PA-C 08/12/19 1646    Wyvonnia Dusky, MD 08/12/19 2053

## 2019-08-12 NOTE — ED Notes (Signed)
Pt ambulated to bathroom without assist

## 2019-08-12 NOTE — ED Triage Notes (Signed)
PT here for 1 week for exertional chest pain with shortness of breath with ambulating. Had covid and cardiac work up that were negative. PT reports diarrhea 1 week ago.

## 2019-08-13 ENCOUNTER — Encounter (HOSPITAL_COMMUNITY): Payer: Self-pay | Admitting: *Deleted

## 2019-08-13 ENCOUNTER — Other Ambulatory Visit: Payer: Self-pay

## 2019-08-13 ENCOUNTER — Encounter (HOSPITAL_COMMUNITY): Payer: Self-pay

## 2019-08-13 DIAGNOSIS — Z8546 Personal history of malignant neoplasm of prostate: Secondary | ICD-10-CM

## 2019-08-13 DIAGNOSIS — N179 Acute kidney failure, unspecified: Secondary | ICD-10-CM

## 2019-08-13 DIAGNOSIS — D62 Acute posthemorrhagic anemia: Secondary | ICD-10-CM

## 2019-08-13 DIAGNOSIS — D649 Anemia, unspecified: Secondary | ICD-10-CM

## 2019-08-13 LAB — BASIC METABOLIC PANEL
Anion gap: 9 (ref 5–15)
BUN: 15 mg/dL (ref 8–23)
CO2: 22 mmol/L (ref 22–32)
Calcium: 9 mg/dL (ref 8.9–10.3)
Chloride: 105 mmol/L (ref 98–111)
Creatinine, Ser: 1.26 mg/dL — ABNORMAL HIGH (ref 0.61–1.24)
GFR calc Af Amer: >60 mL/min (ref >60)
GFR calc non Af Amer: 57 mL/min — ABNORMAL LOW (ref >60)
Glucose, Bld: 98 mg/dL (ref 70–99)
Potassium: 3.4 mmol/L — ABNORMAL LOW (ref 3.5–5.1)
Sodium: 136 mmol/L (ref 135–145)

## 2019-08-13 LAB — HEMOGLOBIN AND HEMATOCRIT, BLOOD
HCT: 25.3 % — ABNORMAL LOW (ref 39.0–52.0)
Hemoglobin: 8.2 g/dL — ABNORMAL LOW (ref 13.0–17.0)

## 2019-08-13 LAB — CBC
HCT: 20.3 % — ABNORMAL LOW (ref 39.0–52.0)
Hemoglobin: 6.5 g/dL — CL (ref 13.0–17.0)
MCH: 27.5 pg (ref 26.0–34.0)
MCHC: 32 g/dL (ref 30.0–36.0)
MCV: 86 fL (ref 80.0–100.0)
Platelets: 306 10*3/uL (ref 150–400)
RBC: 2.36 MIL/uL — ABNORMAL LOW (ref 4.22–5.81)
RDW: 15.8 % — ABNORMAL HIGH (ref 11.5–15.5)
WBC: 7.3 10*3/uL (ref 4.0–10.5)
nRBC: 0 % (ref 0.0–0.2)

## 2019-08-13 LAB — PREPARE RBC (CROSSMATCH)

## 2019-08-13 LAB — ABO/RH: ABO/RH(D): O POS

## 2019-08-13 MED ORDER — SODIUM CHLORIDE 0.9% IV SOLUTION
Freq: Once | INTRAVENOUS | Status: AC
  Administered 2019-08-13: 12:00:00 via INTRAVENOUS

## 2019-08-13 NOTE — Progress Notes (Signed)
Patient states that he wants a EGD and colonoscopy because he states, "My wife died in 04/26/23 of this year because they didn't do a colonoscopy. I want the doctor to call my niece Sharyn Lull and talk with her." Lyndel Safe, MD notified of this.

## 2019-08-13 NOTE — H&P (View-Only) (Signed)
ASSESSMENT AND PLAN;   #1.  GI Bleed. UGI vs LGI. Hb dropped to 6.5, s/p 2U 8.5. Neg colon 2-3 yrs ago by [facility].  Certainly could be a diverticular bleed. Has H/O IDA. #2.  Comorbid conditions include obesity, HTN, AKI, HTN  Plan: -Continue Protonix. -EGD in a.m.  Discussed risks and benefits with the patient in detail. -Hold off on nonsteroidals. -Repeat colonoscopy only if he continues to have bleeding. -Trend CBC. Keep Hb>8. -May need CT Abdo/pelvis which can be performed as an outpatient.   HPI:    Maurice Vang is a 73 y.o. male  With history of HTN, prostate cancer S/P surgery (no radiation) with 5-day history of bloody diarrhea with associated dizziness.  Denies having any nausea, vomiting, epigastric pain.  However, he would take ibuprofen 800 mg p.o. 3 times daily PRN.  Found to have hemoglobin of 6.5, status post 2 units of PRBC to hemoglobin 8.5.  Had colonoscopy performed 2 to 3 years ago which was unremarkable except for diverticulosis.  He lost his wife recently from colon cancer.   Past Medical History:  Diagnosis Date  . Cancer [facility])    prostate  . Hypertension     Past Surgical History:  Procedure Laterality Date  . prostate cancer    . PROSTATE SURGERY    . SMALL INTESTINE SURGERY      Family History  Problem Relation Age of Onset  . Stroke Mother   . Stroke Father     Social History   Tobacco Use  . Smoking status: Former Smoker    Quit date: 1990    Years since quitting: 30.7  . Smokeless tobacco: Never Used  Substance Use Topics  . Alcohol use: No  . Drug use: No    Current Facility-Administered Medications  Medication Dose Route Frequency Provider Last Rate Last Dose  . 0.9 %  sodium chloride infusion   Intravenous Continuous Pahwani, Rinka R, MD      . acetaminophen (TYLENOL) tablet 650 mg  650 mg Oral Q6H PRN Pahwani, Rinka R, MD       Or  . acetaminophen (TYLENOL) suppository 650 mg  650 mg Rectal Q6H PRN Pahwani,  Rinka R, MD      . hydrALAZINE (APRESOLINE) injection 10 mg  10 mg Intravenous Q6H PRN Pahwani, Rinka R, MD      . ondansetron (ZOFRAN) tablet 4 mg  4 mg Oral Q6H PRN Pahwani, Rinka R, MD       Or  . ondansetron (ZOFRAN) injection 4 mg  4 mg Intravenous Q6H PRN Pahwani, Rinka R, MD      . pantoprazole (PROTONIX) EC tablet 40 mg  40 mg Oral Daily Pahwani, Rinka R, MD   40 mg at 08/13/19 0952  . sodium chloride flush (NS) 0.9 % injection 3 mL  3 mL Intravenous Once Trifan, Carola Rhine, MD        Allergies  Allergen Reactions  . Codeine Nausea And Vomiting    Review of Systems:  Constitutional: Denies fever, chills, diaphoresis, appetite change and fatigue.  HEENT: Denies photophobia, eye pain, redness, hearing loss, ear pain, congestion, sore throat, rhinorrhea, sneezing, mouth sores, neck pain, neck stiffness and tinnitus.   Respiratory: Denies SOB, DOE, cough, chest tightness,  and wheezing.   Cardiovascular: Denies chest pain, palpitations and leg swelling.  Genitourinary: Denies dysuria, urgency, frequency, hematuria, flank pain and difficulty urinating.  Musculoskeletal: Denies myalgias, back pain, joint swelling, arthralgias  and gait problem.  Skin: No rash.  Neurological: Denies dizziness, seizures, syncope, weakness, light-headedness, numbness and headaches.  Hematological: Denies adenopathy. Easy bruising, personal or family bleeding history  Psychiatric/Behavioral: No anxiety or depression     Physical Exam:    BP 116/68   Pulse 90   Temp 97.8 F (36.6 C) (Oral)   Resp 18   Ht 6' (1.829 m)   Wt 100.7 kg   SpO2 100%   BMI 30.11 kg/m  Filed Weights   08/13/19 0108 08/13/19 0132  Weight: 100.7 kg 100.7 kg   Constitutional:  Well-developed, in no acute distress. Psychiatric: Normal mood and affect. Behavior is normal. HEENT: Pupils normal.  Conjunctivae are normal. No scleral icterus. Neck supple.  Cardiovascular: Normal rate, regular rhythm. No edema  Pulmonary/chest: Effort normal and breath sounds normal. No wheezing, rales or rhonchi. Abdominal: Soft, nondistended. Nontender. Bowel sounds active throughout. There are no masses palpable. No hepatomegaly. Rectal:  defered Neurological: Alert and oriented to person place and time. Skin: Skin is warm and dry. No rashes noted.  Data Reviewed: I have personally reviewed following labs and imaging studies  CBC: CBC Latest Ref Rng & Units 08/13/2019 08/12/2019 08/12/2019  WBC 4.0 - 10.5 K/uL 7.3 8.5 7.4  Hemoglobin 13.0 - 17.0 g/dL 6.5(LL) 8.5(L) 8.4(L)  Hematocrit 39.0 - 52.0 % 20.3(L) 27.1(L) 26.6(L)  Platelets 150 - 400 K/uL 306 375 364    CMP: CMP Latest Ref Rng & Units 08/13/2019 08/12/2019 08/09/2019  Glucose 70 - 99 mg/dL 98 123(H) 113(H)  BUN 8 - 23 mg/dL 15 20 17   Creatinine 0.61 - 1.24 mg/dL 1.26(H) 1.41(H) 1.36(H)  Sodium 135 - 145 mmol/L 136 136 137  Potassium 3.5 - 5.1 mmol/L 3.4(L) 3.5 3.5  Chloride 98 - 111 mmol/L 105 102 103  CO2 22 - 32 mmol/L 22 22 22   Calcium 8.9 - 10.3 mg/dL 9.0 9.5 9.2   Anemia Panel: Recent Labs    08/12/19 1625  FERRITIN 8*  TIBC 416  IRON 12*    Recent Results (from the past 240 hour(s))  Novel Coronavirus, NAA (Hosp order, Send-out to Ref Lab; TAT 18-24 hrs     Status: None   Collection Time: 08/09/19  9:49 AM   Specimen: Nasopharyngeal Swab; Respiratory  Result Value Ref Range Status   SARS-CoV-2, NAA NOT DETECTED NOT DETECTED Final    Comment: (NOTE) This nucleic acid amplification test was developed and its performance characteristics determined by [employer]. Nucleic acid amplification tests include PCR and TMA. This test has not been FDA cleared or approved. This test has been authorized by FDA under an Emergency Use Authorization (EUA). This test is only authorized for the duration of time the declaration that circumstances exist justifying the authorization of the emergency use of in vitro diagnostic tests for  detection of SARS-CoV-2 virus and/or diagnosis of COVID-19 infection under section 564(b)(1) of the Act, 21 U.S.C. [id number]) (1), unless the authorization is terminated or revoked sooner. When diagnostic testing is negative, the possibility of a false negative result should be considered in the context of a patient's recent exposures and the presence of clinical signs and symptoms consistent with COVID-19. An individual without symptoms of COVID- 19 and who is not shedding SARS-CoV-2 vi rus would expect to have a negative (not detected) result in this assay. Performed At: [facility] [address] [place], [place] [id number] Rush Farmer MD [id number]    [place]  Final    Comment: Performed  at [facility], [place] [address] [place], [place] [address]  SARS CORONAVIRUS 2 (TAT 6-24 HRS) Nasopharyngeal Nasopharyngeal Swab     Status: None   Collection Time: 08/12/19  4:20 PM   Specimen: Nasopharyngeal Swab  Result Value Ref Range Status   SARS Coronavirus 2 NEGATIVE NEGATIVE Final    Comment: (NOTE) SARS-CoV-2 target nucleic acids are NOT DETECTED. The SARS-CoV-2 RNA is generally detectable in upper and lower respiratory specimens during the acute phase of infection. Negative results do not preclude SARS-CoV-2 infection, do not rule out co-infections with other pathogens, and should not be used as the sole basis for treatment or other patient management decisions. Negative results must be combined with clinical observations, patient history, and epidemiological information. The expected result is Negative. Fact Sheet for Patients: SugarRoll.be Fact Sheet for Healthcare Providers: https://www.woods-mathews.com/ This test is not yet approved or cleared by the [place] FDA and  has been authorized for detection and/or diagnosis of SARS-CoV-2 by FDA under an Emergency Use Authorization (EUA).  This EUA will remain  in effect (meaning this test can be used) for the duration of the COVID-19 declaration under Section 56 4(b)(1) of the Act, 21 U.S.C. section 360bbb-3(b)(1), unless the authorization is terminated or revoked sooner. Performed at [facility], [place] [address] [place], [place] [address]       Radiology Studies: Dg Chest 2 View  Result Date: 08/12/2019 CLINICAL DATA:  Chest pain and shortness of breath 1 week. EXAM: CHEST - 2 VIEW COMPARISON:  08/09/2019 FINDINGS: Lungs are adequately inflated and clear. Flattening of the hemidiaphragms on the lateral film. Cardiomediastinal silhouette and remainder of the exam is unchanged. IMPRESSION: No active cardiopulmonary disease. Electronically Signed   By: Marin Olp M.D.   On: 08/12/2019 13:20   Dg Chest 2 View  Result Date: 08/09/2019 CLINICAL DATA:  Shortness of breath EXAM: CHEST - 2 VIEW COMPARISON:  None. FINDINGS: The heart size and mediastinal contours are within normal limits. Both lungs are clear. The visualized skeletal structures are unremarkable. IMPRESSION: No acute abnormality of the lungs. Electronically Signed   By: Eddie Candle M.D.   On: 08/09/2019 10:21      Carmell Austria, MD 08/13/2019, 12:46 PM  Cc: No ref. provider found

## 2019-08-13 NOTE — Consult Note (Signed)
ASSESSMENT AND PLAN;   #1.  GI Bleed. UGI vs LGI. Hb dropped to 6.5, s/p 2U 8.5. Neg colon 2-3 yrs ago by [facility].  Certainly could be a diverticular bleed. Has H/O IDA. #2.  Comorbid conditions include obesity, HTN, AKI, HTN  Plan: -Continue Protonix. -EGD in a.m.  Discussed risks and benefits with the patient in detail. -Hold off on nonsteroidals. -Repeat colonoscopy only if he continues to have bleeding. -Trend CBC. Keep Hb>8. -May need CT Abdo/pelvis which can be performed as an outpatient.   HPI:    Maurice Vang is a 73 y.o. male  With history of HTN, prostate cancer S/P surgery (no radiation) with 5-day history of bloody diarrhea with associated dizziness.  Denies having any nausea, vomiting, epigastric pain.  However, he would take ibuprofen 800 mg p.o. 3 times daily PRN.  Found to have hemoglobin of 6.5, status post 2 units of PRBC to hemoglobin 8.5.  Had colonoscopy performed 2 to 3 years ago which was unremarkable except for diverticulosis.  He lost his wife recently from colon cancer.   Past Medical History:  Diagnosis Date  . Cancer [facility])    prostate  . Hypertension     Past Surgical History:  Procedure Laterality Date  . prostate cancer    . PROSTATE SURGERY    . SMALL INTESTINE SURGERY      Family History  Problem Relation Age of Onset  . Stroke Mother   . Stroke Father     Social History   Tobacco Use  . Smoking status: Former Smoker    Quit date: 1990    Years since quitting: 30.7  . Smokeless tobacco: Never Used  Substance Use Topics  . Alcohol use: No  . Drug use: No    Current Facility-Administered Medications  Medication Dose Route Frequency Provider Last Rate Last Dose  . 0.9 %  sodium chloride infusion   Intravenous Continuous Pahwani, Rinka R, MD      . acetaminophen (TYLENOL) tablet 650 mg  650 mg Oral Q6H PRN Pahwani, Rinka R, MD       Or  . acetaminophen (TYLENOL) suppository 650 mg  650 mg Rectal Q6H PRN Pahwani,  Rinka R, MD      . hydrALAZINE (APRESOLINE) injection 10 mg  10 mg Intravenous Q6H PRN Pahwani, Rinka R, MD      . ondansetron (ZOFRAN) tablet 4 mg  4 mg Oral Q6H PRN Pahwani, Rinka R, MD       Or  . ondansetron (ZOFRAN) injection 4 mg  4 mg Intravenous Q6H PRN Pahwani, Rinka R, MD      . pantoprazole (PROTONIX) EC tablet 40 mg  40 mg Oral Daily Pahwani, Rinka R, MD   40 mg at 08/13/19 0952  . sodium chloride flush (NS) 0.9 % injection 3 mL  3 mL Intravenous Once Trifan, Carola Rhine, MD        Allergies  Allergen Reactions  . Codeine Nausea And Vomiting    Review of Systems:  Constitutional: Denies fever, chills, diaphoresis, appetite change and fatigue.  HEENT: Denies photophobia, eye pain, redness, hearing loss, ear pain, congestion, sore throat, rhinorrhea, sneezing, mouth sores, neck pain, neck stiffness and tinnitus.   Respiratory: Denies SOB, DOE, cough, chest tightness,  and wheezing.   Cardiovascular: Denies chest pain, palpitations and leg swelling.  Genitourinary: Denies dysuria, urgency, frequency, hematuria, flank pain and difficulty urinating.  Musculoskeletal: Denies myalgias, back pain, joint swelling, arthralgias  and gait problem.  Skin: No rash.  Neurological: Denies dizziness, seizures, syncope, weakness, light-headedness, numbness and headaches.  Hematological: Denies adenopathy. Easy bruising, personal or family bleeding history  Psychiatric/Behavioral: No anxiety or depression     Physical Exam:    BP 116/68   Pulse 90   Temp 97.8 F (36.6 C) (Oral)   Resp 18   Ht 6' (1.829 m)   Wt 100.7 kg   SpO2 100%   BMI 30.11 kg/m  Filed Weights   08/13/19 0108 08/13/19 0132  Weight: 100.7 kg 100.7 kg   Constitutional:  Well-developed, in no acute distress. Psychiatric: Normal mood and affect. Behavior is normal. HEENT: Pupils normal.  Conjunctivae are normal. No scleral icterus. Neck supple.  Cardiovascular: Normal rate, regular rhythm. No edema  Pulmonary/chest: Effort normal and breath sounds normal. No wheezing, rales or rhonchi. Abdominal: Soft, nondistended. Nontender. Bowel sounds active throughout. There are no masses palpable. No hepatomegaly. Rectal:  defered Neurological: Alert and oriented to person place and time. Skin: Skin is warm and dry. No rashes noted.  Data Reviewed: I have personally reviewed following labs and imaging studies  CBC: CBC Latest Ref Rng & Units 08/13/2019 08/12/2019 08/12/2019  WBC 4.0 - 10.5 K/uL 7.3 8.5 7.4  Hemoglobin 13.0 - 17.0 g/dL 6.5(LL) 8.5(L) 8.4(L)  Hematocrit 39.0 - 52.0 % 20.3(L) 27.1(L) 26.6(L)  Platelets 150 - 400 K/uL 306 375 364    CMP: CMP Latest Ref Rng & Units 08/13/2019 08/12/2019 08/09/2019  Glucose 70 - 99 mg/dL 98 123(H) 113(H)  BUN 8 - 23 mg/dL 15 20 17   Creatinine 0.61 - 1.24 mg/dL 1.26(H) 1.41(H) 1.36(H)  Sodium 135 - 145 mmol/L 136 136 137  Potassium 3.5 - 5.1 mmol/L 3.4(L) 3.5 3.5  Chloride 98 - 111 mmol/L 105 102 103  CO2 22 - 32 mmol/L 22 22 22   Calcium 8.9 - 10.3 mg/dL 9.0 9.5 9.2   Anemia Panel: Recent Labs    08/12/19 1625  FERRITIN 8*  TIBC 416  IRON 12*    Recent Results (from the past 240 hour(s))  Novel Coronavirus, NAA (Hosp order, Send-out to Ref Lab; TAT 18-24 hrs     Status: None   Collection Time: 08/09/19  9:49 AM   Specimen: Nasopharyngeal Swab; Respiratory  Result Value Ref Range Status   SARS-CoV-2, NAA NOT DETECTED NOT DETECTED Final    Comment: (NOTE) This nucleic acid amplification test was developed and its performance characteristics determined by [employer]. Nucleic acid amplification tests include PCR and TMA. This test has not been FDA cleared or approved. This test has been authorized by FDA under an Emergency Use Authorization (EUA). This test is only authorized for the duration of time the declaration that circumstances exist justifying the authorization of the emergency use of in vitro diagnostic tests for  detection of SARS-CoV-2 virus and/or diagnosis of COVID-19 infection under section 564(b)(1) of the Act, 21 U.S.C. [id number]) (1), unless the authorization is terminated or revoked sooner. When diagnostic testing is negative, the possibility of a false negative result should be considered in the context of a patient's recent exposures and the presence of clinical signs and symptoms consistent with COVID-19. An individual without symptoms of COVID- 19 and who is not shedding SARS-CoV-2 vi rus would expect to have a negative (not detected) result in this assay. Performed At: [facility] [address] [place], [place] [id number] Rush Farmer MD [id number]    [place]  Final    Comment: Performed  at [facility], [place] [address], [place], [place] [address]  SARS CORONAVIRUS 2 (TAT 6-24 HRS) Nasopharyngeal Nasopharyngeal Swab     Status: None   Collection Time: 08/12/19  4:20 PM   Specimen: Nasopharyngeal Swab  Result Value Ref Range Status   SARS Coronavirus 2 NEGATIVE NEGATIVE Final    Comment: (NOTE) SARS-CoV-2 target nucleic acids are NOT DETECTED. The SARS-CoV-2 RNA is generally detectable in upper and lower respiratory specimens during the acute phase of infection. Negative results do not preclude SARS-CoV-2 infection, do not rule out co-infections with other pathogens, and should not be used as the sole basis for treatment or other patient management decisions. Negative results must be combined with clinical observations, patient history, and epidemiological information. The expected result is Negative. Fact Sheet for Patients: SugarRoll.be Fact Sheet for Healthcare Providers: https://www.woods-mathews.com/ This test is not yet approved or cleared by the [place] FDA and  has been authorized for detection and/or diagnosis of SARS-CoV-2 by FDA under an Emergency Use Authorization (EUA).  This EUA will remain  in effect (meaning this test can be used) for the duration of the COVID-19 declaration under Section 56 4(b)(1) of the Act, 21 U.S.C. section 360bbb-3(b)(1), unless the authorization is terminated or revoked sooner. Performed at [facility], [place] [address] [place], [place] [address]       Radiology Studies: Dg Chest 2 View  Result Date: 08/12/2019 CLINICAL DATA:  Chest pain and shortness of breath 1 week. EXAM: CHEST - 2 VIEW COMPARISON:  08/09/2019 FINDINGS: Lungs are adequately inflated and clear. Flattening of the hemidiaphragms on the lateral film. Cardiomediastinal silhouette and remainder of the exam is unchanged. IMPRESSION: No active cardiopulmonary disease. Electronically Signed   By: Marin Olp M.D.   On: 08/12/2019 13:20   Dg Chest 2 View  Result Date: 08/09/2019 CLINICAL DATA:  Shortness of breath EXAM: CHEST - 2 VIEW COMPARISON:  None. FINDINGS: The heart size and mediastinal contours are within normal limits. Both lungs are clear. The visualized skeletal structures are unremarkable. IMPRESSION: No acute abnormality of the lungs. Electronically Signed   By: Eddie Candle M.D.   On: 08/09/2019 10:21      Carmell Austria, MD 08/13/2019, 12:46 PM  Cc: No ref. provider found

## 2019-08-13 NOTE — Progress Notes (Signed)
Admitted to [facility]M06 via stretcher from ED. Belongings with patient. Valuables sent to Security. Receipt in chart. Assessment as charted.

## 2019-08-13 NOTE — Progress Notes (Signed)
Dr.Spongberg paged re: Hgb 6.5. Orders received.

## 2019-08-13 NOTE — Progress Notes (Signed)
PROGRESS NOTE    Maurice Vang  [id number] DOB: [birth year] DOA: 08/12/2019 PCP: Seward Carol, MD   Brief Narrative:  Maurice Vang is a 73 y.o. male with medical history significant of hypertension, prostate cancer-status post surgical intervention several years back presents to emergency department due to chest discomfort and shortness of breath since 5 days.  Reports that his symptoms are mostly with exertion.  Chest pain is 4-5 out of 10 in intensity, nonradiating, dull in nature, aggravates with walking and relieved with rest, denies association with nausea, vomiting, diaphoresis.  Reports shortness of breath usually with exertion denies association with palpitation, orthopnea, PND.  Reports bloody diarrhea since 5 days.  2 episodes per day, denies association with mucus, foul-smelling, recent use of antibiotics or travel.  Denies nausea, vomiting, epigastric pain.  He takes ibuprofen 800 mg as needed for shoulder pain and headache.  His last intake was 2 to 3 weeks ago.  Reports that he had colonoscopy 2 to 3 years ago which was negative.  Had diverticulitis 15 years ago.  Denies family history of colon cancer, decreased appetite, weight loss or night sweats.  He went to urgent care on 08/09/2019 with chest discomfort and was found to have low hemoglobin.  He was advised to follow-up with PCP.  He lives alone and independent on daily life activities.  Denies smoking, alcohol, illicit drug use.   Assessment & Plan:   Active Problems:   Anemia associated with acute blood loss   Hypertension   History of prostate cancer   Symptomatic anemia   Acute blood loss anemia: Bloody diarrhea x6 days with exertional chest pain shortness of breath Hemodynamically stable Heme positive in the ED Negative troponins Chest x-ray negative COVID negative Hemoglobin dropped to 6.5 from 8.5 transfused 2 units packed red blood cells GI consulted on admission-Will await their  recommendations   Symptomatic anemia: As noted , patient presented with exertional chest pain and shortness of breath. Troponin negative x2, d-dimer: WNL.  Chest x-ray is negative. Transfusion of 2 units as above  Hypertension: Stable Continue to  hold home dose of lisinopril and HCTZ at this time due to mild AKI Monitor his blood pressure closely. Hydralazine PRN.Marland Kitchen Resume his home meds once kidney function back to normal.  Mild AKI: Likely secondary to dehydration due to diarrhea Improved with hydration, received IV fluid bolus in ED.  Continue IV fluids.   Avoid nephrotoxic medication. Monitor BMP daily.  History of prostate cancer: Status post remote surgery Patient denies any urinary symptoms.  DVT prophylaxis: SCD/Compression stockings  Code Status: Full    Code Status Orders  (From admission, onward)         Start     Ordered   08/12/19 1522  Full code  Continuous     08/12/19 1523        Code Status History    This patient has a current code status but no historical code status.   Advance Care Planning Activity    Advance Directive Documentation     Most Recent Value  Type of Advance Directive  Healthcare Power of Attorney, Living will  Pre-existing out of facility DNR order (yellow form or pink MOST form)  --  "MOST" Form in Place?  --     Family Communication: Discussed in detail with patient Disposition Plan:   Patient remained inpatient for further subspecialty evaluation, transfusion of blood products, laboratory monitoring.  Without these treatments patient risk of severe life-threatening clinical  deterioration Consults called: None Admission status: Inpatient   Consultants:   GI by admitting team  Procedures:  Dg Chest 2 View  Result Date: 08/12/2019 CLINICAL DATA:  Chest pain and shortness of breath 1 week. EXAM: CHEST - 2 VIEW COMPARISON:  08/09/2019 FINDINGS: Lungs are adequately inflated and clear. Flattening of the hemidiaphragms on  the lateral film. Cardiomediastinal silhouette and remainder of the exam is unchanged. IMPRESSION: No active cardiopulmonary disease. Electronically Signed   By: Marin Olp M.D.   On: 08/12/2019 13:20   Dg Chest 2 View  Result Date: 08/09/2019 CLINICAL DATA:  Shortness of breath EXAM: CHEST - 2 VIEW COMPARISON:  None. FINDINGS: The heart size and mediastinal contours are within normal limits. Both lungs are clear. The visualized skeletal structures are unremarkable. IMPRESSION: No acute abnormality of the lungs. Electronically Signed   By: Eddie Candle M.D.   On: 08/09/2019 10:21     Antimicrobials:   none    Subjective: Patient with bowel movement with blood in it this morning per his report Hemodynamically stable Abdominal pain  Objective: Vitals:   08/13/19 0132 08/13/19 0200 08/13/19 0710 08/13/19 0808  BP:  118/83 124/71 114/70  Pulse:  92 90 88  Resp:  18 18 18   Temp:  98.9 F (37.2 C) 98.7 F (37.1 C) 98.2 F (36.8 C)  TempSrc:  Oral Oral Oral  SpO2:  100% 100% 98%  Weight: 100.7 kg     Height: 6' (1.829 m)       Intake/Output Summary (Last 24 hours) at 08/13/2019 0839 Last data filed at 08/13/2019 0700 Gross per 24 hour  Intake 570 ml  Output 251 ml  Net 319 ml   Filed Weights   08/13/19 0108 08/13/19 0132  Weight: 100.7 kg 100.7 kg    Examination:  General exam: Appears calm and comfortable  Respiratory system: Clear to auscultation. Respiratory effort normal. Cardiovascular system: S1 & S2 heard, RRR. No JVD, murmurs, rubs, gallops or clicks. No pedal edema. Gastrointestinal system: Abdomen is nondistended, soft and nontender. No organomegaly or masses felt. Normal bowel sounds heard. Central nervous system: Alert and oriented. No focal neurological deficits. Extremities:wwp, no edema Skin: No rashes, lesions or ulcers Psychiatry: Judgement and insight appear normal. Mood & affect appropriate.     Data Reviewed: I have personally reviewed  following labs and imaging studies  CBC: Recent Labs  Lab 08/09/19 0947 08/12/19 1227 08/12/19 1405 08/13/19 0526  WBC 7.7 7.4 8.5 7.3  NEUTROABS  --   --  6.2  --   HGB 8.7* 8.4* 8.5* 6.5*  HCT 25.9* 26.6* 27.1* 20.3*  MCV 85.2 88.1 87.7 86.0  PLT 229 364 375 [id number]   Basic Metabolic Panel: Recent Labs  Lab 08/09/19 0947 08/12/19 1227 08/13/19 0526  NA 137 136 136  K 3.5 3.5 3.4*  CL 103 102 105  CO2 22 22 22   GLUCOSE 113* 123* 98  BUN 17 20 15   CREATININE 1.36* 1.41* 1.26*  CALCIUM 9.2 9.5 9.0   GFR: Estimated Creatinine Clearance: 65.1 mL/min (A) (by C-G formula based on SCr of 1.26 mg/dL (H)). Liver Function Tests: No results for input(s): AST, ALT, ALKPHOS, BILITOT, PROT, ALBUMIN in the last 168 hours. No results for input(s): LIPASE, AMYLASE in the last 168 hours. No results for input(s): AMMONIA in the last 168 hours. Coagulation Profile: Recent Labs  Lab 08/12/19 1405  INR 1.1   Cardiac Enzymes: No results for input(s): CKTOTAL, CKMB, CKMBINDEX, TROPONINI in the  last 168 hours. BNP (last 3 results) No results for input(s): PROBNP in the last 8760 hours. HbA1C: No results for input(s): HGBA1C in the last 72 hours. CBG: No results for input(s): GLUCAP in the last 168 hours. Lipid Profile: No results for input(s): CHOL, HDL, LDLCALC, TRIG, CHOLHDL, LDLDIRECT in the last 72 hours. Thyroid Function Tests: No results for input(s): TSH, T4TOTAL, FREET4, T3FREE, THYROIDAB in the last 72 hours. Anemia Panel: Recent Labs    08/12/19 1625  FERRITIN 8*  TIBC 416  IRON 12*   Sepsis Labs: No results for input(s): PROCALCITON, LATICACIDVEN in the last 168 hours.  Recent Results (from the past 240 hour(s))  Novel Coronavirus, NAA (Hosp order, Send-out to Ref Lab; TAT 18-24 hrs     Status: None   Collection Time: 08/09/19  9:49 AM   Specimen: Nasopharyngeal Swab; Respiratory  Result Value Ref Range Status   SARS-CoV-2, NAA NOT DETECTED NOT DETECTED Final     Comment: (NOTE) This nucleic acid amplification test was developed and its performance characteristics determined by [employer]. Nucleic acid amplification tests include PCR and TMA. This test has not been FDA cleared or approved. This test has been authorized by FDA under an Emergency Use Authorization (EUA). This test is only authorized for the duration of time the declaration that circumstances exist justifying the authorization of the emergency use of in vitro diagnostic tests for detection of SARS-CoV-2 virus and/or diagnosis of COVID-19 infection under section 564(b)(1) of the Act, 21 U.S.C. [id number]) (1), unless the authorization is terminated or revoked sooner. When diagnostic testing is negative, the possibility of a false negative result should be considered in the context of a patient's recent exposures and the presence of clinical signs and symptoms consistent with COVID-19. An individual without symptoms of COVID- 19 and who is not shedding SARS-CoV-2 vi rus would expect to have a negative (not detected) result in this assay. Performed At: [facility] [address] [place], [place] [id number] Rush Farmer MD [id number]    [place]  Final    Comment: Performed at [facility], [place] [address], [place], [place] [address]  SARS CORONAVIRUS 2 (TAT 6-24 HRS) Nasopharyngeal Nasopharyngeal Swab     Status: None   Collection Time: 08/12/19  4:20 PM   Specimen: Nasopharyngeal Swab  Result Value Ref Range Status   SARS Coronavirus 2 NEGATIVE NEGATIVE Final    Comment: (NOTE) SARS-CoV-2 target nucleic acids are NOT DETECTED. The SARS-CoV-2 RNA is generally detectable in upper and lower respiratory specimens during the acute phase of infection. Negative results do not preclude SARS-CoV-2 infection, do not rule out co-infections with other pathogens, and should not be used as the sole basis for treatment or other patient  management decisions. Negative results must be combined with clinical observations, patient history, and epidemiological information. The expected result is Negative. Fact Sheet for Patients: SugarRoll.be Fact Sheet for Healthcare Providers: https://www.woods-mathews.com/ This test is not yet approved or cleared by the [place] FDA and  has been authorized for detection and/or diagnosis of SARS-CoV-2 by FDA under an Emergency Use Authorization (EUA). This EUA will remain  in effect (meaning this test can be used) for the duration of the COVID-19 declaration under Section 56 4(b)(1) of the Act, 21 U.S.C. section 360bbb-3(b)(1), unless the authorization is terminated or revoked sooner. Performed at [facility], [place] [address] [place], [place] [address]          Radiology Studies: Dg Chest  2 View  Result Date: 08/12/2019 CLINICAL DATA:  Chest pain and shortness of breath 1 week. EXAM: CHEST - 2 VIEW COMPARISON:  08/09/2019 FINDINGS: Lungs are adequately inflated and clear. Flattening of the hemidiaphragms on the lateral film. Cardiomediastinal silhouette and remainder of the exam is unchanged. IMPRESSION: No active cardiopulmonary disease. Electronically Signed   By: Marin Olp M.D.   On: 08/12/2019 13:20        Scheduled Meds:  sodium chloride   Intravenous Once   pantoprazole  40 mg Oral Daily   sodium chloride flush  3 mL Intravenous Once   Continuous Infusions:  sodium chloride       LOS: 1 day    Time spent: 35 min    Nicolette Bang, MD [facility]  If 7PM-7AM, please contact night-coverage  08/13/2019, 8:39 AM

## 2019-08-13 NOTE — ED Notes (Signed)
ED TO INPATIENT HANDOFF REPORT  ED Nurse Name and Phone #:  Levada Dy [phone]  S Name/Age/Gender Maurice Vang 73 y.o. male Room/Bed: 036C/036C  Code Status   Code Status: Full Code  Home/SNF/Other Home Patient oriented to: self, place, time and situation Is this baseline? Yes   Triage Complete: Triage complete  Chief Complaint Chest pain; sob x4 days  Triage Note PT here for 1 week for exertional chest pain with shortness of breath with ambulating. Had covid and cardiac work up that were negative. PT reports diarrhea 1 week ago.    Allergies Allergies  Allergen Reactions  . Codeine Nausea And Vomiting    Level of Care/Admitting Diagnosis ED Disposition    ED Disposition Condition [facility] Area: [place] [[id number]]  Level of Care: Med-Surg [16]  Covid Evaluation: Asymptomatic Screening Protocol (No Symptoms)  Diagnosis: Anemia associated with acute blood loss [id number]  Admitting Physician: Mckinley Jewel [id number]  Attending Physician: Mckinley Jewel [id number]  Estimated length of stay: past midnight tomorrow  Certification:: I certify this patient will need inpatient services for at least 2 midnights  PT Class (Do Not Modify): Inpatient [101]  PT Acc Code (Do Not Modify): Private [1]       B Medical/Surgery History Past Medical History:  Diagnosis Date  . Cancer [facility])    prostate  . Hypertension    Past Surgical History:  Procedure Laterality Date  . prostate cancer    . PROSTATE SURGERY    . SMALL INTESTINE SURGERY       A IV Location/Drains/Wounds Patient Lines/Drains/Airways Status   Active Line/Drains/Airways    Name:   Placement date:   Placement time:   Site:   Days:   Peripheral IV 08/12/19 Left Antecubital   08/12/19    1415    Antecubital   1          Intake/Output Last 24 hours No intake or output data in the 24 hours ending 08/13/19 0006  Labs/Imaging Results for orders placed or performed  during the hospital encounter of 08/12/19 (from the past 48 hour(s))  Basic metabolic panel     Status: Abnormal   Collection Time: 08/12/19 12:27 PM  Result Value Ref Range   Sodium 136 135 - 145 mmol/L   Potassium 3.5 3.5 - 5.1 mmol/L   Chloride 102 98 - 111 mmol/L   CO2 22 22 - 32 mmol/L   Glucose, Bld 123 (H) 70 - 99 mg/dL   BUN 20 8 - 23 mg/dL   Creatinine, Ser 1.41 (H) 0.61 - 1.24 mg/dL   Calcium 9.5 8.9 - 10.3 mg/dL   GFR calc non Af Amer 49 (L) >60 mL/min   GFR calc Af Amer 57 (L) >60 mL/min   Anion gap 12 5 - 15    Comment: Performed at [facility] [address] [place], [place] [address]  CBC     Status: Abnormal   Collection Time: 08/12/19 12:27 PM  Result Value Ref Range   WBC 7.4 4.0 - 10.5 K/uL   RBC 3.02 (L) 4.22 - 5.81 MIL/uL   Hemoglobin 8.4 (L) 13.0 - 17.0 g/dL   HCT 26.6 (L) 39.0 - 52.0 %   MCV 88.1 80.0 - 100.0 fL   MCH 27.8 26.0 - 34.0 pg   MCHC 31.6 30.0 - 36.0 g/dL   RDW 15.7 (H) 11.5 - 15.5 %   Platelets 364 150 - 400 K/uL  nRBC 0.0 0.0 - 0.2 %    Comment: Performed at [facility], [place] [address] [place], [place] [address]  Troponin I (High Sensitivity)     Status: None   Collection Time: 08/12/19 12:27 PM  Result Value Ref Range   Troponin I (High Sensitivity) 6 <18 ng/L    Comment: (NOTE) Elevated high sensitivity troponin I (hsTnI) values and significant  changes across serial measurements may suggest ACS but many other  chronic and acute conditions are known to elevate hsTnI results.  Refer to the "Links" section for chest pain algorithms and additional  guidance. Performed at [facility], [place] [address], [place], [place] [address]   POC occult blood, ED     Status: Abnormal   Collection Time: 08/12/19  1:54 PM  Result Value Ref Range   Fecal Occult Bld POSITIVE (A) NEGATIVE  Troponin I (High Sensitivity)     Status: None   Collection Time: 08/12/19  2:05 PM  Result Value Ref Range   Troponin I (High Sensitivity)  4 <18 ng/L    Comment: (NOTE) Elevated high sensitivity troponin I (hsTnI) values and significant  changes across serial measurements may suggest ACS but many other  chronic and acute conditions are known to elevate hsTnI results.  Refer to the "Links" section for chest pain algorithms and additional  guidance. Performed at [facility], [place] [address], [place], [place] [address]   D-dimer, quantitative (not at [facility])     Status: None   Collection Time: 08/12/19  2:05 PM  Result Value Ref Range   D-Dimer, Quant 0.46 0.00 - 0.50 ug/mL-FEU    Comment: (NOTE) At the manufacturer cut-off of 0.50 ug/mL FEU, this assay has been documented to exclude PE with a sensitivity and negative predictive value of 97 to 99%.  At this time, this assay has not been approved by the FDA to exclude DVT/VTE. Results should be correlated with clinical presentation. Performed at [facility], [place] [address], [place], [place] [address]   Differential     Status: None   Collection Time: 08/12/19  2:05 PM  Result Value Ref Range   Neutrophils Relative % 73 %   Neutro Abs 6.2 1.7 - 7.7 K/uL   Lymphocytes Relative 16 %   Lymphs Abs 1.4 0.7 - 4.0 K/uL   Monocytes Relative 10 %   Monocytes Absolute 0.8 0.1 - 1.0 K/uL   Eosinophils Relative 0 %   Eosinophils Absolute 0.0 0.0 - 0.5 K/uL   Basophils Relative 0 %   Basophils Absolute 0.0 0.0 - 0.1 K/uL   Immature Granulocytes 1 %   Abs Immature Granulocytes 0.05 0.00 - 0.07 K/uL    Comment: Performed at [facility] [address] [place], [place] [address]  CBC     Status: Abnormal   Collection Time: 08/12/19  2:05 PM  Result Value Ref Range   WBC 8.5 4.0 - 10.5 K/uL   RBC 3.09 (L) 4.22 - 5.81 MIL/uL   Hemoglobin 8.5 (L) 13.0 - 17.0 g/dL   HCT 27.1 (L) 39.0 - 52.0 %   MCV 87.7 80.0 - 100.0 fL   MCH 27.5 26.0 - 34.0 pg   MCHC 31.4 30.0 - 36.0 g/dL   RDW 15.8 (H) 11.5 - 15.5 %   Platelets 375 150 - 400 K/uL   nRBC 0.0 0.0 - 0.2 %     Comment: Performed at [facility]  [address] [place], [place] [address]  Protime-INR     Status: None   Collection Time: 08/12/19  2:05 PM  Result Value Ref Range   Prothrombin Time 13.6 11.4 - 15.2 seconds   INR 1.1 0.8 - 1.2    Comment: (NOTE) INR goal varies based on device and disease states. Performed at [facility], [place] [address] [place], [place] [address]   APTT     Status: None   Collection Time: 08/12/19  2:05 PM  Result Value Ref Range   aPTT 26 24 - 36 seconds    Comment: Performed at [facility] [address], [place], [place] [address]  SARS CORONAVIRUS 2 (TAT 6-24 HRS) Nasopharyngeal Nasopharyngeal Swab     Status: None   Collection Time: 08/12/19  4:20 PM   Specimen: Nasopharyngeal Swab  Result Value Ref Range   SARS Coronavirus 2 NEGATIVE NEGATIVE    Comment: (NOTE) SARS-CoV-2 target nucleic acids are NOT DETECTED. The SARS-CoV-2 RNA is generally detectable in upper and lower respiratory specimens during the acute phase of infection. Negative results do not preclude SARS-CoV-2 infection, do not rule out co-infections with other pathogens, and should not be used as the sole basis for treatment or other patient management decisions. Negative results must be combined with clinical observations, patient history, and epidemiological information. The expected result is Negative. Fact Sheet for Patients: SugarRoll.be Fact Sheet for Healthcare Providers: https://www.woods-mathews.com/ This test is not yet approved or cleared by the [place] FDA and  has been authorized for detection and/or diagnosis of SARS-CoV-2 by FDA under an Emergency Use Authorization (EUA). This EUA will remain  in effect (meaning this test can be used) for the duration of the COVID-19 declaration under Section 56 4(b)(1) of the Act, 21 U.S.C. section 360bbb-3(b)(1), unless the authorization is terminated or revoked  sooner. Performed at [facility], [place] [address] [place], [place] [address]   Urinalysis, Routine w reflex microscopic     Status: None   Collection Time: 08/12/19  4:20 PM  Result Value Ref Range   Color, Urine YELLOW YELLOW   APPearance CLEAR CLEAR   Specific Gravity, Urine 1.018 1.005 - 1.030   pH 5.0 5.0 - 8.0   Glucose, UA NEGATIVE NEGATIVE mg/dL   Hgb urine dipstick NEGATIVE NEGATIVE   Bilirubin Urine NEGATIVE NEGATIVE   Ketones, ur NEGATIVE NEGATIVE mg/dL   Protein, ur NEGATIVE NEGATIVE mg/dL   Nitrite NEGATIVE NEGATIVE   Leukocytes,Ua NEGATIVE NEGATIVE    Comment: Performed at [facility] [address] [place], [place] [address]  Iron and TIBC     Status: Abnormal   Collection Time: 08/12/19  4:25 PM  Result Value Ref Range   Iron 12 (L) 45 - 182 ug/dL   TIBC 416 250 - 450 ug/dL   Saturation Ratios 3 (L) 17.9 - 39.5 %   UIBC 404 ug/dL    Comment: Performed at [facility], [place] [address] [place], [place] [address]  Ferritin     Status: Abnormal   Collection Time: 08/12/19  4:25 PM  Result Value Ref Range   Ferritin 8 (L) 24 - 336 ng/mL    Comment: Performed at [facility], [place] [address], [place], [place] [address]   Dg Chest 2 View  Result Date: 08/12/2019 CLINICAL DATA:  Chest pain and shortness of breath 1 week. EXAM: CHEST - 2 VIEW COMPARISON:  08/09/2019 FINDINGS: Lungs are adequately inflated and  clear. Flattening of the hemidiaphragms on the lateral film. Cardiomediastinal silhouette and remainder of the exam is unchanged. IMPRESSION: No active cardiopulmonary disease. Electronically Signed   By: Marin Olp M.D.   On: 08/12/2019 13:20    Pending Labs Unresulted Labs (From admission, onward)    Start     Ordered   08/13/19 [id number]  Basic metabolic panel  Tomorrow morning,   R     08/12/19 1523   08/13/19 0500  CBC  Tomorrow morning,   R     08/12/19 1523          Vitals/Pain Today's Vitals   08/12/19 1800 08/12/19 1900  08/12/19 2358 08/13/19 0000  BP: 119/70 130/70 (!) 102/54 (!) 105/49  Pulse:  (!) 111 96 96  Resp: (!) 22 (!) 26 (!) 23 (!) 44  Temp:      TempSrc:      SpO2: 97% 97% 98% 100%  PainSc:        Isolation Precautions No active isolations  Medications Medications  sodium chloride flush (NS) 0.9 % injection 3 mL (3 mLs Intravenous Not Given 08/12/19 1915)  acetaminophen (TYLENOL) tablet 650 mg (has no administration in time range)    Or  acetaminophen (TYLENOL) suppository 650 mg (has no administration in time range)  ondansetron (ZOFRAN) tablet 4 mg (has no administration in time range)    Or  ondansetron (ZOFRAN) injection 4 mg (has no administration in time range)  pantoprazole (PROTONIX) EC tablet 40 mg (40 mg Oral Given 08/12/19 1626)  hydrALAZINE (APRESOLINE) injection 10 mg (has no administration in time range)  0.9 %  sodium chloride infusion ( Intravenous New Bag/Given 08/12/19 1626)  sodium chloride 0.9 % bolus 1,000 mL (0 mLs Intravenous Stopped 08/12/19 1625)    Mobility walks Low fall risk   Focused Assessments Cardiac Assessment Handoff:  Cardiac Rhythm: Sinus tachycardia No results found for: CKTOTAL, CKMB, CKMBINDEX, TROPONINI Lab Results  Component Value Date   DDIMER 0.46 08/12/2019   Does the Patient currently have chest pain? No     R Recommendations: See Admitting Provider Note  Report given to:   Additional Notes: Occult card positive no chest pain no sob

## 2019-08-14 ENCOUNTER — Encounter (HOSPITAL_COMMUNITY)
Admission: EM | Disposition: A | Discharge status: Home / Self Care | Payer: Self-pay | Source: Home / Self Care | Attending: Internal Medicine

## 2019-08-14 ENCOUNTER — Encounter (HOSPITAL_COMMUNITY): Payer: Self-pay | Admitting: *Deleted

## 2019-08-14 ENCOUNTER — Inpatient Hospital Stay (HOSPITAL_COMMUNITY): Payer: Medicare Other | Admitting: Certified Registered"

## 2019-08-14 DIAGNOSIS — R195 Other fecal abnormalities: Secondary | ICD-10-CM

## 2019-08-14 DIAGNOSIS — K2971 Gastritis, unspecified, with bleeding: Secondary | ICD-10-CM

## 2019-08-14 DIAGNOSIS — K922 Gastrointestinal hemorrhage, unspecified: Secondary | ICD-10-CM

## 2019-08-14 HISTORY — PX: ESOPHAGOGASTRODUODENOSCOPY (EGD) WITH PROPOFOL: SHX5813

## 2019-08-14 HISTORY — PX: BIOPSY: SHX5522

## 2019-08-14 LAB — CBC
HCT: 24.4 % — ABNORMAL LOW (ref 39.0–52.0)
Hemoglobin: 8.2 g/dL — ABNORMAL LOW (ref 13.0–17.0)
MCH: 28.6 pg (ref 26.0–34.0)
MCHC: 33.6 g/dL (ref 30.0–36.0)
MCV: 85 fL (ref 80.0–100.0)
Platelets: 309 10*3/uL (ref 150–400)
RBC: 2.87 MIL/uL — ABNORMAL LOW (ref 4.22–5.81)
RDW: 15 % (ref 11.5–15.5)
WBC: 7.3 10*3/uL (ref 4.0–10.5)
nRBC: 0 % (ref 0.0–0.2)

## 2019-08-14 LAB — BASIC METABOLIC PANEL
Anion gap: 7 (ref 5–15)
BUN: 14 mg/dL (ref 8–23)
CO2: 22 mmol/L (ref 22–32)
Calcium: 8.4 mg/dL — ABNORMAL LOW (ref 8.9–10.3)
Chloride: 109 mmol/L (ref 98–111)
Creatinine, Ser: 1.16 mg/dL (ref 0.61–1.24)
GFR calc Af Amer: >60 mL/min (ref >60)
GFR calc non Af Amer: >60 mL/min (ref >60)
Glucose, Bld: 97 mg/dL (ref 70–99)
Potassium: 3.4 mmol/L — ABNORMAL LOW (ref 3.5–5.1)
Sodium: 138 mmol/L (ref 135–145)

## 2019-08-14 LAB — TYPE AND SCREEN
ABO/RH(D): O POS
Antibody Screen: NEGATIVE
Unit division: 0
Unit division: 0

## 2019-08-14 LAB — BPAM RBC
Blood Product Expiration Date: 202010312359
Blood Product Expiration Date: 202011012359
ISSUE DATE / TIME: 202010051122
ISSUE DATE / TIME: 202010051517
Unit Type and Rh: 5100
Unit Type and Rh: 5100

## 2019-08-14 SURGERY — ESOPHAGOGASTRODUODENOSCOPY (EGD) WITH PROPOFOL
Anesthesia: Monitor Anesthesia Care

## 2019-08-14 MED ORDER — SODIUM CHLORIDE 0.9 % IV SOLN: INTRAVENOUS | Status: DC

## 2019-08-14 MED ORDER — PROPOFOL 10 MG/ML IV BOLUS
INTRAVENOUS | Status: DC | PRN
  Administered 2019-08-14: 30 mg via INTRAVENOUS
  Administered 2019-08-14 (×2): 20 mg via INTRAVENOUS

## 2019-08-14 MED ORDER — LACTATED RINGERS IV SOLN
INTRAVENOUS | Status: DC
  Administered 2019-08-14: 09:00:00 via INTRAVENOUS

## 2019-08-14 MED ORDER — BISACODYL 5 MG PO TBEC
10.0000 mg | DELAYED_RELEASE_TABLET | Freq: Once | ORAL | Status: AC
  Administered 2019-08-14: 10 mg via ORAL
  Filled 2019-08-14: qty 2

## 2019-08-14 MED ORDER — PEG 3350-KCL-NA BICARB-NACL 420 G PO SOLR
4000.0000 mL | Freq: Once | ORAL | Status: AC
  Administered 2019-08-14: 4000 mL via ORAL
  Filled 2019-08-14: qty 4000

## 2019-08-14 MED ORDER — PEG 3350-KCL-NA BICARB-NACL 420 G PO SOLR
4000.0000 mL | Freq: Once | ORAL | Status: DC
  Filled 2019-08-14: qty 4000

## 2019-08-14 MED ORDER — PROPOFOL 500 MG/50ML IV EMUL
INTRAVENOUS | Status: DC | PRN
  Administered 2019-08-14: 100 ug/kg/min via INTRAVENOUS

## 2019-08-14 SURGICAL SUPPLY — 15 items

## 2019-08-14 NOTE — Plan of Care (Signed)
  Problem: Education: Goal: Knowledge of General Education information will improve Description: Including pain rating scale, medication(s)/side effects and non-pharmacologic comfort measures Outcome: Progressing   Problem: Activity: Goal: Risk for activity intolerance will decrease Outcome: Progressing   

## 2019-08-14 NOTE — Anesthesia Postprocedure Evaluation (Signed)
Anesthesia Post Note  Patient: Maurice Vang  Procedure(s) Performed: ESOPHAGOGASTRODUODENOSCOPY (EGD) WITH PROPOFOL (N/A ) BIOPSY     Patient location during evaluation: PACU Anesthesia Type: MAC Level of consciousness: awake and alert Pain management: pain level controlled Vital Signs Assessment: post-procedure vital signs reviewed and stable Respiratory status: spontaneous breathing, nonlabored ventilation, respiratory function stable and patient connected to nasal cannula oxygen Cardiovascular status: stable and blood pressure returned to baseline Postop Assessment: no apparent nausea or vomiting Anesthetic complications: no    Last Vitals:  Vitals:   08/14/19 1020 08/14/19 1030  BP: 107/64 112/66  Pulse: 72 78  Resp: (!) 22 20  Temp:    SpO2: 100% 100%    Last Pain:  Vitals:   08/14/19 1030  TempSrc:   PainSc: 0-No pain                 Cyenna Rebello DAVID

## 2019-08-14 NOTE — Anesthesia Preprocedure Evaluation (Signed)
Anesthesia Evaluation  Patient identified by MRN, date of birth, ID band Patient awake    Reviewed: Allergy & Precautions, NPO status , Patient's Chart, lab work & pertinent test results  Airway Mallampati: I  TM Distance: >3 FB Neck ROM: Full    Dental   Pulmonary former smoker,    Pulmonary exam normal        Cardiovascular hypertension, Pt. on medications Normal cardiovascular exam     Neuro/Psych    GI/Hepatic   Endo/Other    Renal/GU      Musculoskeletal   Abdominal   Peds  Hematology   Anesthesia Other Findings   Reproductive/Obstetrics                             Anesthesia Physical Anesthesia Plan  ASA: II  Anesthesia Plan: MAC   Post-op Pain Management:    Induction: Intravenous  PONV Risk Score and Plan: 1 and Treatment may vary due to age or medical condition  Airway Management Planned: Nasal Cannula  Additional Equipment:   Intra-op Plan:   Post-operative Plan:   Informed Consent: I have reviewed the patients History and Physical, chart, labs and discussed the procedure including the risks, benefits and alternatives for the proposed anesthesia with the patient or authorized representative who has indicated his/her understanding and acceptance.       Plan Discussed with: CRNA and Surgeon  Anesthesia Plan Comments:         Anesthesia Quick Evaluation

## 2019-08-14 NOTE — Op Note (Signed)
[facility] Patient Name: Chandlor Jerkins Procedure Date : 08/14/2019 MRN: [id number] Attending MD: Jackquline Denmark , MD Date of Birth: Jan 22, 1946 CSN: [id number] Age: 73 Admit Type: Inpatient Procedure:                Upper GI endoscopy Indications:              Recent gastrointestinal bleeding Providers:                Jackquline Denmark, MD, Grace Isaac, RN, Cherylynn Ridges,                            Technician, Claybon Jabs CRNA, CRNA Referring MD:              Medicines:                Monitored Anesthesia Care Complications:            No immediate complications. Estimated Blood Loss:     Estimated blood loss: none. Procedure:                Pre-Anesthesia Assessment:                           - Prior to the procedure, a History and Physical                            was performed, and patient medications and                            allergies were reviewed. [name] tolerance of                            previous anesthesia was also reviewed. The risks                            and benefits of the procedure and the sedation                            options and risks were discussed with the patient.                            All questions were answered, and informed consent                            was obtained. Prior Anticoagulants: The patient has                            taken no previous anticoagulant or antiplatelet                            agents. ASA Grade Assessment: II - A patient with                            mild systemic disease. After reviewing the risks  and benefits, the patient was deemed in                            satisfactory condition to undergo the procedure.                           After obtaining informed consent, the endoscope was                            passed under direct vision. Throughout the                            procedure, the patient's blood pressure, pulse, and    oxygen saturations were monitored continuously. The                            [id number]) Olympus gastroscope was                            introduced through the mouth, and advanced to the                            second part of duodenum. The upper GI endoscopy was                            accomplished without difficulty. The patient                            tolerated the procedure well. Scope In: Scope Out: Findings:      The examined esophagus was normal.      The Z-line was regular and was found 38 cm from the incisors.      A 2 cm hiatal hernia was present.      Localized mild inflammation characterized by erythema was found in the       gastric antrum. Biopsies were taken with a cold forceps for histology.      The examined duodenum was normal. Biopsies were taken with a cold       forceps for histology. Impression:               - 2 cm hiatal hernia.                           - Mild Gastritis. Biopsied.                           - No UGI Bleeding. Recommendation:           - Clear liquid diet.                           - Colonoscopy in a.m. Discussed risks and benefits.                            I also discussed above with patient's niece who in  a Therapist, sports.                           - Await pathology results.                           - Continue Protonix 40 mg p.o. once a day                           - Avoid ibuprofen, naproxen, or other non-steroidal                            anti-inflammatory drugs. Procedure Code(s):        --- Professional ---                           [id number], Esophagogastroduodenoscopy, flexible,                            transoral; with biopsy, single or multiple Diagnosis Code(s):        --- Professional ---                           K44.9, Diaphragmatic hernia without obstruction or                            gangrene                           K29.70, Gastritis, unspecified, without bleeding                            K92.2, Gastrointestinal hemorrhage, unspecified CPT copyright 2019 American Medical Association. All rights reserved. The codes documented in this report are preliminary and upon coder review may  be revised to meet current compliance requirements. Jackquline Denmark, MD 08/14/2019 10:11:11 AM This report has been signed electronically. Number of Addenda: 0

## 2019-08-14 NOTE — Interval H&P Note (Signed)
History and Physical Interval Note:  08/14/2019 9:35 AM  Maurice Vang  has presented today for surgery, with the diagnosis of GI Bleed.  The various methods of treatment have been discussed with the patient and family. After consideration of risks, benefits and other options for treatment, the patient has consented to  Procedure(s): ESOPHAGOGASTRODUODENOSCOPY (EGD) WITH PROPOFOL (N/A) as a surgical intervention.  [name] history has been reviewed, patient examined, no change in status, stable for surgery.  I have reviewed the patient's chart and labs.  Questions were answered to the patient's satisfaction.     Jackquline Denmark

## 2019-08-14 NOTE — Transfer of Care (Signed)
Immediate Anesthesia Transfer of Care Note  Patient: Maurice Vang  Procedure(s) Performed: ESOPHAGOGASTRODUODENOSCOPY (EGD) WITH PROPOFOL (N/A ) BIOPSY  Patient Location: Endoscopy Unit  Anesthesia Type:MAC  Level of Consciousness: drowsy and patient cooperative  Airway & Oxygen Therapy: Patient Spontanous Breathing and Patient connected to nasal cannula oxygen  Post-op Assessment: Report given to RN, Post -op Vital signs reviewed and stable and Patient moving all extremities  Post vital signs: Reviewed and stable  Last Vitals:  Vitals Value Taken Time  BP 97/58 08/14/19 1010  Temp 36.4 C 08/14/19 1010  Pulse 70 08/14/19 1011  Resp 28 08/14/19 1011  SpO2 100 % 08/14/19 1011  Vitals shown include unvalidated device data.  Last Pain:  Vitals:   08/14/19 1010  TempSrc: Tympanic  PainSc: 0-No pain      Patients Stated Pain Goal: 2 (09/98/33 8250)  Complications: No apparent anesthesia complications

## 2019-08-14 NOTE — Progress Notes (Signed)
PROGRESS NOTE        PATIENT DETAILS Name: Maurice Vang Age: 73 y.o. Sex: male Date of Birth: 12/26/1945 Admit Date: 08/12/2019 Admitting Physician Darliss Cheney, MD [id number], Jori Moll, MD  Brief Narrative: Patient is a 73 y.o. male medical history of HTN, prostate cancer who presented with exertional dyspnea and  GI bleeding-found to have acute blood loss anemia.  See below for further details.  Subjective: Hematochezia has improved-he had a "touch" of blood with his stool this morning.  Assessment/Plan: GI bleeding with acute blood loss anemia: Hematochezia has significantly improved-hemoglobin stable.  Underwent EGD today that did not show any foci of bleeding.  Plans are for colonoscopy tomorrow.  Patient is s/p 2 units of PRBC.  AKI: Likely hemodynamically mediated-resolved with supportive care.  Follow periodically.  HTN: BP stable-without the use of any antihypertensives.  Plan is to resume antihypertensives when able.  History of prostate cancer   Diet: Diet Order            Diet NPO time specified  Diet effective midnight        Diet clear liquid Room service appropriate? Yes; Fluid consistency: Thin  Diet effective now               DVT Prophylaxis: SCD's  Code Status: Full code   Family Communication: None at bedside  Disposition Plan: Remain inpatient-Home in the next day or so after GI work-up is complete.  Antimicrobial agents: Anti-infectives (From admission, onward)   None      Procedures: 10/6>> EGD  CONSULTS:  GI  Time spent: 25- minutes-Greater than 50% of this time was spent in counseling, explanation of diagnosis, planning of further management, and coordination of care.  MEDICATIONS: Scheduled Meds: . bisacodyl  10 mg Oral Once  . pantoprazole  40 mg Oral Daily  . polyethylene glycol-electrolytes  4,000 mL Oral Once  . sodium chloride flush  3 mL Intravenous Once   Continuous Infusions: . sodium  chloride 100 mL/hr at 08/13/19 2318  . sodium chloride     PRN Meds:.acetaminophen **OR** acetaminophen, hydrALAZINE, ondansetron **OR** ondansetron (ZOFRAN) IV   PHYSICAL EXAM: Vital signs: Vitals:   08/14/19 0850 08/14/19 1010 08/14/19 1020 08/14/19 1030  BP: (!) 152/79 (!) 97/58 107/64 112/66  Pulse: 88  72 78  Resp: (!) 23 18 (!) 22 20  Temp: 98.1 F (36.7 C) 97.6 F (36.4 C)    TempSrc: Temporal Tympanic    SpO2: 100%  100% 100%  Weight:      Height:       Filed Weights   08/13/19 0108 08/13/19 0132 08/13/19 2031  Weight: 100.7 kg 100.7 kg 100.4 kg   Body mass index is 30.02 kg/m.   Gen Exam:Alert awake-not in any distress HEENT:atraumatic, normocephalic Chest: B/L clear to auscultation anteriorly CVS:S1S2 regular Abdomen:soft non tender, non distended Extremities:no edema Neurology: Non focal Skin: no rash  I have personally reviewed following labs and imaging studies  LABORATORY DATA: CBC: Recent Labs  Lab 08/09/19 0947 08/12/19 1227 08/12/19 1405 08/13/19 0526 08/13/19 2024 08/14/19 0736  WBC 7.7 7.4 8.5 7.3  --  7.3  NEUTROABS  --   --  6.2  --   --   --   HGB 8.7* 8.4* 8.5* 6.5* 8.2* 8.2*  HCT 25.9* 26.6* 27.1* 20.3* 25.3* 24.4*  MCV 85.2 88.1  87.7 86.0  --  85.0  PLT 229 364 375 306  --  [id number]    Basic Metabolic Panel: Recent Labs  Lab 08/09/19 0947 08/12/19 1227 08/13/19 0526 08/14/19 0736  NA 137 136 136 138  K 3.5 3.5 3.4* 3.4*  CL 103 102 105 109  CO2 22 22 22 22   GLUCOSE 113* 123* 98 97  BUN 17 20 15 14   CREATININE 1.36* 1.41* 1.26* 1.16  CALCIUM 9.2 9.5 9.0 8.4*    GFR: Estimated Creatinine Clearance: 70.6 mL/min (by C-G formula based on SCr of 1.16 mg/dL).  Liver Function Tests: No results for input(s): AST, ALT, ALKPHOS, BILITOT, PROT, ALBUMIN in the last 168 hours. No results for input(s): LIPASE, AMYLASE in the last 168 hours. No results for input(s): AMMONIA in the last 168 hours.  Coagulation Profile: Recent Labs   Lab 08/12/19 1405  INR 1.1    Cardiac Enzymes: No results for input(s): CKTOTAL, CKMB, CKMBINDEX, TROPONINI in the last 168 hours.  BNP (last 3 results) No results for input(s): PROBNP in the last 8760 hours.  HbA1C: No results for input(s): HGBA1C in the last 72 hours.  CBG: No results for input(s): GLUCAP in the last 168 hours.  Lipid Profile: No results for input(s): CHOL, HDL, LDLCALC, TRIG, CHOLHDL, LDLDIRECT in the last 72 hours.  Thyroid Function Tests: No results for input(s): TSH, T4TOTAL, FREET4, T3FREE, THYROIDAB in the last 72 hours.  Anemia Panel: Recent Labs    08/12/19 1625  FERRITIN 8*  TIBC 416  IRON 12*    Urine analysis:    Component Value Date/Time   COLORURINE YELLOW 08/12/2019 1620   APPEARANCEUR CLEAR 08/12/2019 1620   LABSPEC 1.018 08/12/2019 1620   PHURINE 5.0 08/12/2019 1620   GLUCOSEU NEGATIVE 08/12/2019 1620   HGBUR NEGATIVE 08/12/2019 1620   BILIRUBINUR NEGATIVE 08/12/2019 1620   KETONESUR NEGATIVE 08/12/2019 1620   PROTEINUR NEGATIVE 08/12/2019 1620   NITRITE NEGATIVE 08/12/2019 1620   LEUKOCYTESUR NEGATIVE 08/12/2019 1620    Sepsis Labs: Lactic Acid, Venous No results found for: LATICACIDVEN  MICROBIOLOGY: Recent Results (from the past 240 hour(s))  Novel Coronavirus, NAA (Hosp order, Send-out to Ref Lab; TAT 18-24 hrs     Status: None   Collection Time: 08/09/19  9:49 AM   Specimen: Nasopharyngeal Swab; Respiratory  Result Value Ref Range Status   SARS-CoV-2, NAA NOT DETECTED NOT DETECTED Final    Comment: (NOTE) This nucleic acid amplification test was developed and its performance characteristics determined by [employer]. Nucleic acid amplification tests include PCR and TMA. This test has not been FDA cleared or approved. This test has been authorized by FDA under an Emergency Use Authorization (EUA). This test is only authorized for the duration of time the declaration that circumstances exist justifying  the authorization of the emergency use of in vitro diagnostic tests for detection of SARS-CoV-2 virus and/or diagnosis of COVID-19 infection under section 564(b)(1) of the Act, 21 U.S.C. [id number]) (1), unless the authorization is terminated or revoked sooner. When diagnostic testing is negative, the possibility of a false negative result should be considered in the context of a patient's recent exposures and the presence of clinical signs and symptoms consistent with COVID-19. An individual without symptoms of COVID- 19 and who is not shedding SARS-CoV-2 vi rus would expect to have a negative (not detected) result in this assay. Performed At: [facility] [address] [place], [place] [id number] Rush Farmer MD [id number]    [place]  Final    Comment: Performed at [facility], [place] [address], [place], [place] [address]  SARS CORONAVIRUS 2 (TAT 6-24 HRS) Nasopharyngeal Nasopharyngeal Swab     Status: None   Collection Time: 08/12/19  4:20 PM   Specimen: Nasopharyngeal Swab  Result Value Ref Range Status   SARS Coronavirus 2 NEGATIVE NEGATIVE Final    Comment: (NOTE) SARS-CoV-2 target nucleic acids are NOT DETECTED. The SARS-CoV-2 RNA is generally detectable in upper and lower respiratory specimens during the acute phase of infection. Negative results do not preclude SARS-CoV-2 infection, do not rule out co-infections with other pathogens, and should not be used as the sole basis for treatment or other patient management decisions. Negative results must be combined with clinical observations, patient history, and epidemiological information. The expected result is Negative. Fact Sheet for Patients: SugarRoll.be Fact Sheet for Healthcare Providers: https://www.woods-mathews.com/ This test is not yet approved or cleared by the [place] FDA and  has been authorized for detection and/or  diagnosis of SARS-CoV-2 by FDA under an Emergency Use Authorization (EUA). This EUA will remain  in effect (meaning this test can be used) for the duration of the COVID-19 declaration under Section 56 4(b)(1) of the Act, 21 U.S.C. section 360bbb-3(b)(1), unless the authorization is terminated or revoked sooner. Performed at [facility], [place] [address] [place], [place] [address]     RADIOLOGY STUDIES/RESULTS: Dg Chest 2 View  Result Date: 08/12/2019 CLINICAL DATA:  Chest pain and shortness of breath 1 week. EXAM: CHEST - 2 VIEW COMPARISON:  08/09/2019 FINDINGS: Lungs are adequately inflated and clear. Flattening of the hemidiaphragms on the lateral film. Cardiomediastinal silhouette and remainder of the exam is unchanged. IMPRESSION: No active cardiopulmonary disease. Electronically Signed   By: Marin Olp M.D.   On: 08/12/2019 13:20   Dg Chest 2 View  Result Date: 08/09/2019 CLINICAL DATA:  Shortness of breath EXAM: CHEST - 2 VIEW COMPARISON:  None. FINDINGS: The heart size and mediastinal contours are within normal limits. Both lungs are clear. The visualized skeletal structures are unremarkable. IMPRESSION: No acute abnormality of the lungs. Electronically Signed   By: Eddie Candle M.D.   On: 08/09/2019 10:21     LOS: 2 days   Oren Binet, MD  [facility]  If 7PM-7AM, please contact night-coverage  Please page via [email or web address]  Go to [email or web address] and use [facility] universal password to access. If you do not have the password, please contact the hospital operator.  Locate the [facility] provider you are looking for under [facility] and page to a number that you can be directly reached. If you still have difficulty reaching the provider, please page the [facility] (Director on Call) for the Hospitalists listed on amion for assistance.  08/14/2019, 1:02 PM

## 2019-08-15 ENCOUNTER — Inpatient Hospital Stay (HOSPITAL_COMMUNITY): Payer: Medicare Other | Admitting: Certified Registered"

## 2019-08-15 ENCOUNTER — Encounter (HOSPITAL_COMMUNITY): Payer: Self-pay | Admitting: Gastroenterology

## 2019-08-15 ENCOUNTER — Other Ambulatory Visit: Payer: Self-pay | Admitting: Physician Assistant

## 2019-08-15 ENCOUNTER — Encounter (HOSPITAL_COMMUNITY)
Admission: EM | Disposition: A | Discharge status: Home / Self Care | Payer: Self-pay | Source: Home / Self Care | Attending: Internal Medicine

## 2019-08-15 DIAGNOSIS — K625 Hemorrhage of anus and rectum: Secondary | ICD-10-CM

## 2019-08-15 HISTORY — PX: COLONOSCOPY WITH PROPOFOL: SHX5780

## 2019-08-15 LAB — BASIC METABOLIC PANEL
Anion gap: 6 (ref 5–15)
BUN: 8 mg/dL (ref 8–23)
CO2: 22 mmol/L (ref 22–32)
Calcium: 8.5 mg/dL — ABNORMAL LOW (ref 8.9–10.3)
Chloride: 111 mmol/L (ref 98–111)
Creatinine, Ser: 1.16 mg/dL (ref 0.61–1.24)
GFR calc Af Amer: >60 mL/min (ref >60)
GFR calc non Af Amer: >60 mL/min (ref >60)
Glucose, Bld: 102 mg/dL — ABNORMAL HIGH (ref 70–99)
Potassium: 3.1 mmol/L — ABNORMAL LOW (ref 3.5–5.1)
Sodium: 139 mmol/L (ref 135–145)

## 2019-08-15 LAB — CBC
HCT: 23.5 % — ABNORMAL LOW (ref 39.0–52.0)
Hemoglobin: 7.5 g/dL — ABNORMAL LOW (ref 13.0–17.0)
MCH: 27.6 pg (ref 26.0–34.0)
MCHC: 31.9 g/dL (ref 30.0–36.0)
MCV: 86.4 fL (ref 80.0–100.0)
Platelets: 321 10*3/uL (ref 150–400)
RBC: 2.72 MIL/uL — ABNORMAL LOW (ref 4.22–5.81)
RDW: 14.9 % (ref 11.5–15.5)
WBC: 7 10*3/uL (ref 4.0–10.5)
nRBC: 0 % (ref 0.0–0.2)

## 2019-08-15 SURGERY — COLONOSCOPY WITH PROPOFOL
Anesthesia: Monitor Anesthesia Care

## 2019-08-15 MED ORDER — LIDOCAINE 2% (20 MG/ML) 5 ML SYRINGE
INTRAMUSCULAR | Status: DC | PRN
  Administered 2019-08-15: 100 mg via INTRAVENOUS

## 2019-08-15 MED ORDER — PROPOFOL 10 MG/ML IV BOLUS
INTRAVENOUS | Status: DC | PRN
  Administered 2019-08-15: 50 mg via INTRAVENOUS
  Administered 2019-08-15: 30 mg via INTRAVENOUS

## 2019-08-15 MED ORDER — PHENYLEPHRINE 40 MCG/ML (10ML) SYRINGE FOR IV PUSH (FOR BLOOD PRESSURE SUPPORT)
PREFILLED_SYRINGE | INTRAVENOUS | Status: DC | PRN
  Administered 2019-08-15 (×3): 160 ug via INTRAVENOUS

## 2019-08-15 MED ORDER — PROPOFOL 500 MG/50ML IV EMUL
INTRAVENOUS | Status: DC | PRN
  Administered 2019-08-15: 125 ug/kg/min via INTRAVENOUS

## 2019-08-15 MED ORDER — LACTATED RINGERS IV SOLN
INTRAVENOUS | Status: DC
  Administered 2019-08-15: 15:00:00 via INTRAVENOUS

## 2019-08-15 SURGICAL SUPPLY — 22 items

## 2019-08-15 NOTE — Interval H&P Note (Signed)
History and Physical Interval Note:  08/15/2019 3:37 PM  Maurice Vang  has presented today for surgery, with the diagnosis of GI bleed.  The various methods of treatment have been discussed with the patient and family. After consideration of risks, benefits and other options for treatment, the patient has consented to  Procedure(s): COLONOSCOPY WITH PROPOFOL (N/A) as a surgical intervention.  [name] history has been reviewed, patient examined, no change in status, stable for surgery.  I have reviewed the patient's chart and labs.  Questions were answered to the patient's satisfaction.     Jackquline Denmark

## 2019-08-15 NOTE — Anesthesia Postprocedure Evaluation (Signed)
Anesthesia Post Note  Patient: Maurice Vang  Procedure(s) Performed: COLONOSCOPY WITH PROPOFOL (N/A )     Patient location during evaluation: Endoscopy Anesthesia Type: MAC Level of consciousness: awake and alert Pain management: pain level controlled Vital Signs Assessment: post-procedure vital signs reviewed and stable Respiratory status: spontaneous breathing, nonlabored ventilation, respiratory function stable and patient connected to nasal cannula oxygen Cardiovascular status: stable and blood pressure returned to baseline Postop Assessment: no apparent nausea or vomiting Anesthetic complications: no    Last Vitals:  Vitals:   08/15/19 1633 08/15/19 1640  BP: (!) 111/59 116/67  Pulse: 76 79  Resp: (!) 25 (!) 21  Temp:    SpO2: 99% 97%    Last Pain:  Vitals:   08/15/19 1640  TempSrc:   PainSc: 0-No pain                 Khamron Gellert COKER

## 2019-08-15 NOTE — Progress Notes (Signed)
[facility] PROGRESS NOTE    Progress Note  Maurice Vang  [id number] DOB: Oct 12, 1946 DOA: 08/12/2019 PCP: Seward Carol, MD     Brief Narrative:   Maurice Vang is an 73 y.o. male past medical history of essential hypertension, prostate cancer who presents with exertional dyspnea found to have a GI bleed and acute blood loss anemia.  Assessment/Plan:   Lower GI bleed/ Anemia associated with acute blood loss: Underwent EGD on 08/14/2019 showing that show no signs of bleeding, patient is scheduled for colonoscopy on 08/15/2019. Status post 2 units of packed red blood cells. On admission hemoglobin was 6.5 today 7.5 continue to trend hemoglobin.  Acute kidney injury: Hemodynamically stable continue supportive care. Likely prerenal azotemia in the setting of acute GI bleed.  Essential hypertension: Blood pressure stable without hypertensive medication.   DVT prophylaxis: [facility] Family Communication:daughter Disposition Plan/Barrier to D/C: home in 24 hra Code Status:     Code Status Orders  (From admission, onward)         Start     Ordered   08/12/19 1522  Full code  Continuous     08/12/19 1523        Code Status History    This patient has a current code status but no historical code status.   Advance Care Planning Activity    Advance Directive Documentation     Most Recent Value  Type of Advance Directive  Healthcare Power of Attorney, Living will  Pre-existing out of facility DNR order (yellow form or pink MOST form)  -  "MOST" Form in Place?  -        IV Access:    Peripheral IV   Procedures and diagnostic studies:   No results found.   Medical Consultants:    None.  Anti-Infectives:   None  Subjective:    Maurice Vang no complains, just a little anxious  Objective:    Vitals:   08/14/19 2007 08/14/19 2155 08/15/19 0512 08/15/19 1025  BP: 139/70 114/65 106/73 104/61  Pulse: 81 83 92 87  Resp: 15 16 16 18   Temp:  98.9 F (37.2 C) 98.9 F (37.2 C) 99.1 F (37.3 C) 98.8 F (37.1 C)  TempSrc: Oral Oral Oral Oral  SpO2: 100% 100% 100% 100%  Weight:  100.5 kg    Height:       SpO2: 100 %   Intake/Output Summary (Last 24 hours) at 08/15/2019 1320 Last data filed at 08/15/2019 1017 Gross per 24 hour  Intake 1253.7 ml  Output 500 ml  Net 753.7 ml   Filed Weights   08/13/19 0132 08/13/19 2031 08/14/19 2155  Weight: 100.7 kg 100.4 kg 100.5 kg    Exam: General exam: In no acute distress. Respiratory system: Good air movement and clear to auscultation. Cardiovascular system: S1 & S2 heard, RRR. No JVD.  Gastrointestinal system: Abdomen is nondistended, soft and nontender.  Central nervous system: Alert and oriented. No focal neurological deficits. Extremities: No pedal edema. Skin: No rashes, lesions or ulcers Psychiatry: Judgement and insight appear normal. Mood & affect appropriate.    Data Reviewed:    Labs: Basic Metabolic Panel: Recent Labs  Lab 08/09/19 0947 08/12/19 1227 08/13/19 0526 08/14/19 0736 08/15/19 0348  NA 137 136 136 138 139  K 3.5 3.5 3.4* 3.4* 3.1*  CL 103 102 105 109 111  CO2 22 22 22 22 22   GLUCOSE 113* 123* 98 97 102*  BUN 17 20 15 14  8  CREATININE 1.36* 1.41* 1.26* 1.16 1.16  CALCIUM 9.2 9.5 9.0 8.4* 8.5*   GFR Estimated Creatinine Clearance: 70.7 mL/min (by C-G formula based on SCr of 1.16 mg/dL). Liver Function Tests: No results for input(s): AST, ALT, ALKPHOS, BILITOT, PROT, ALBUMIN in the last 168 hours. No results for input(s): LIPASE, AMYLASE in the last 168 hours. No results for input(s): AMMONIA in the last 168 hours. Coagulation profile Recent Labs  Lab 08/12/19 1405  INR 1.1   COVID-19 Labs  Recent Labs    08/12/19 1405 08/12/19 1625  DDIMER 0.46  --   FERRITIN  --  8*    Lab Results  Component Value Date   SARSCOV2NAA NEGATIVE 08/12/2019   SARSCOV2NAA NOT DETECTED 08/09/2019    CBC: Recent Labs  Lab 08/12/19 1227  08/12/19 1405 08/13/19 0526 08/13/19 2024 08/14/19 0736 08/15/19 0348  WBC 7.4 8.5 7.3  --  7.3 7.0  NEUTROABS  --  6.2  --   --   --   --   HGB 8.4* 8.5* 6.5* 8.2* 8.2* 7.5*  HCT 26.6* 27.1* 20.3* 25.3* 24.4* 23.5*  MCV 88.1 87.7 86.0  --  85.0 86.4  PLT 364 375 306  --  309 321   Cardiac Enzymes: No results for input(s): CKTOTAL, CKMB, CKMBINDEX, TROPONINI in the last 168 hours. BNP (last 3 results) No results for input(s): PROBNP in the last 8760 hours. CBG: No results for input(s): GLUCAP in the last 168 hours. D-Dimer: Recent Labs    08/12/19 1405  DDIMER 0.46   Hgb A1c: No results for input(s): HGBA1C in the last 72 hours. Lipid Profile: No results for input(s): CHOL, HDL, LDLCALC, TRIG, CHOLHDL, LDLDIRECT in the last 72 hours. Thyroid function studies: No results for input(s): TSH, T4TOTAL, T3FREE, THYROIDAB in the last 72 hours.  Invalid input(s): FREET3 Anemia work up: Recent Labs    08/12/19 1625  FERRITIN 8*  TIBC 416  IRON 12*   Sepsis Labs: Recent Labs  Lab 08/12/19 1405 08/13/19 0526 08/14/19 0736 08/15/19 0348  WBC 8.5 7.3 7.3 7.0   Microbiology Recent Results (from the past 240 hour(s))  Novel Coronavirus, NAA (Hosp order, Send-out to Ref Lab; TAT 18-24 hrs     Status: None   Collection Time: 08/09/19  9:49 AM   Specimen: Nasopharyngeal Swab; Respiratory  Result Value Ref Range Status   SARS-CoV-2, NAA NOT DETECTED NOT DETECTED Final    Comment: (NOTE) This nucleic acid amplification test was developed and its performance characteristics determined by [employer]. Nucleic acid amplification tests include PCR and TMA. This test has not been FDA cleared or approved. This test has been authorized by FDA under an Emergency Use Authorization (EUA). This test is only authorized for the duration of time the declaration that circumstances exist justifying the authorization of the emergency use of in vitro diagnostic tests for detection  of SARS-CoV-2 virus and/or diagnosis of COVID-19 infection under section 564(b)(1) of the Act, 21 U.S.C. [id number]) (1), unless the authorization is terminated or revoked sooner. When diagnostic testing is negative, the possibility of a false negative result should be considered in the context of a patient's recent exposures and the presence of clinical signs and symptoms consistent with COVID-19. An individual without symptoms of COVID- 19 and who is not shedding SARS-CoV-2 vi rus would expect to have a negative (not detected) result in this assay. Performed At: [facility] [address] [place], [place] [id number] Rush Farmer MD [id number]  Source NASOPHARYNGEAL  Final    Comment: Performed at [facility], [place] [address] [place], [place] [address]  SARS CORONAVIRUS 2 (TAT 6-24 HRS) Nasopharyngeal Nasopharyngeal Swab     Status: None   Collection Time: 08/12/19  4:20 PM   Specimen: Nasopharyngeal Swab  Result Value Ref Range Status   SARS Coronavirus 2 NEGATIVE NEGATIVE Final    Comment: (NOTE) SARS-CoV-2 target nucleic acids are NOT DETECTED. The SARS-CoV-2 RNA is generally detectable in upper and lower respiratory specimens during the acute phase of infection. Negative results do not preclude SARS-CoV-2 infection, do not rule out co-infections with other pathogens, and should not be used as the sole basis for treatment or other patient management decisions. Negative results must be combined with clinical observations, patient history, and epidemiological information. The expected result is Negative. Fact Sheet for Patients: SugarRoll.be Fact Sheet for Healthcare Providers: https://www.woods-mathews.com/ This test is not yet approved or cleared by the [place] FDA and  has been authorized for detection and/or diagnosis of SARS-CoV-2 by FDA under an Emergency Use Authorization (EUA). This EUA  will remain  in effect (meaning this test can be used) for the duration of the COVID-19 declaration under Section 56 4(b)(1) of the Act, 21 U.S.C. section 360bbb-3(b)(1), unless the authorization is terminated or revoked sooner. Performed at [facility], [place] [address] [place], [place] [address]      Medications:   . pantoprazole  40 mg Oral Daily  . sodium chloride flush  3 mL Intravenous Once   Continuous Infusions: . sodium chloride 100 mL/hr at 08/14/19 1849  . sodium chloride        LOS: 3 days   Charlynne Cousins  [facility]  08/15/2019, 1:20 PM

## 2019-08-15 NOTE — Plan of Care (Signed)
  Problem: Education: Goal: Knowledge of General Education information will improve Description Including pain rating scale, medication(s)/side effects and non-pharmacologic comfort measures Outcome: Progressing   Problem: Health Behavior/Discharge Planning: Goal: Ability to manage health-related needs will improve Outcome: Progressing   

## 2019-08-15 NOTE — Anesthesia Preprocedure Evaluation (Signed)
Anesthesia Evaluation  Patient identified by MRN, date of birth, ID band Patient awake    Reviewed: Allergy & Precautions, NPO status , Patient's Chart, lab work & pertinent test results  Airway Mallampati: II  TM Distance: >3 FB Neck ROM: Full    Dental  (+) Teeth Intact, Dental Advisory Given   Pulmonary former smoker,    breath sounds clear to auscultation       Cardiovascular hypertension,  Rhythm:Regular Rate:Normal     Neuro/Psych    GI/Hepatic   Endo/Other    Renal/GU      Musculoskeletal   Abdominal   Peds  Hematology   Anesthesia Other Findings   Reproductive/Obstetrics                             Anesthesia Physical Anesthesia Plan  ASA: III  Anesthesia Plan: MAC   Post-op Pain Management:    Induction: Intravenous  PONV Risk Score and Plan: Ondansetron  Airway Management Planned: Natural Airway and Simple Face Mask  Additional Equipment:   Intra-op Plan:   Post-operative Plan:   Informed Consent: I have reviewed the patients History and Physical, chart, labs and discussed the procedure including the risks, benefits and alternatives for the proposed anesthesia with the patient or authorized representative who has indicated his/her understanding and acceptance.       Plan Discussed with: CRNA and Anesthesiologist  Anesthesia Plan Comments:         Anesthesia Quick Evaluation

## 2019-08-15 NOTE — Op Note (Signed)
[facility] Patient Name: Maurice Vang Procedure Date : 08/15/2019 MRN: [id number] Attending MD: Jackquline Denmark , MD Date of Birth: Dec 01, 1945 CSN: [id number] Age: 73 Admit Type: Inpatient Procedure:                Colonoscopy Indications:              Rectal bleeding Providers:                Jackquline Denmark, MD, Raynelle Bring, RN, Lina Sar,                            Technician, Lance Coon, CRNA Referring MD:              Medicines:                Monitored Anesthesia Care Complications:            No immediate complications. Estimated Blood Loss:     Estimated blood loss: none. Procedure:                Pre-Anesthesia Assessment:                           - Prior to the procedure, a History and Physical                            was performed, and patient medications and                            allergies were reviewed. [name] tolerance of                            previous anesthesia was also reviewed. The risks                            and benefits of the procedure and the sedation                            options and risks were discussed with the patient.                            All questions were answered, and informed consent                            was obtained. Prior Anticoagulants: The patient has                            taken no previous anticoagulant or antiplatelet                            agents. ASA Grade Assessment: II - A patient with                            mild systemic disease. After reviewing the risks  and benefits, the patient was deemed in                            satisfactory condition to undergo the procedure.                           After obtaining informed consent, the colonoscope                            was passed under direct vision. Throughout the                            procedure, the patient's blood pressure, pulse, and                            oxygen saturations were  monitored continuously. The                            [id number]) Olympus colonoscope was                            introduced through the anus and advanced to mid                            sigmoid colon. The scope could not be advanced                            further due to formation of loops, extensive                            diverticulosis. Thereafter, we switched to                            pediatric colonoscope which was easily passed under                            direct visualization to the cecum. Thereafter, 2 cm                            of terminal ileum was intubated. The colonoscopy                            was performed without difficulty. The patient                            tolerated the procedure well. The quality of the                            bowel preparation was adequate to identify polyps.                            Some retained stool. Aggressive suctioning  aspiration was performed. Overall over 95% of the                            colonic mucosa visualized satisfactorily. The                            terminal ileum, ileocecal valve, appendiceal                            orifice, and rectum were photographed. Scope In: 3:56:56 PM Scope Out: 4:24:27 PM Scope Withdrawal Time: 0 hours 10 minutes 57 seconds  Total Procedure Duration: 0 hours 27 minutes 31 seconds  Findings:      Multiple small and large-mouthed diverticula were found in the entire       colon, predominantly in the sigmoid colon. Some degree of sigmoid       narrowing consistent with muscular hypertrophy. Few diverticula had       stool impacted. There was no evidence of diverticular bleeding.      There was evidence of a prior end-to-side colo-colonic anastomosis in       the transverse colon. This was patent and was characterized by healthy       appearing mucosa. The anastomosis was traversed.      Non-bleeding internal hemorrhoids were found  during digital exam. The       hemorrhoids were Grade IV (internal hemorrhoids that prolapse and cannot       be reduced manually).      The terminal ileum appeared normal.      The exam was otherwise without abnormality on direct and retroflexion       views. Impression:               - Pancolonic diverticulosis predominantly in the                            sigmoid colon. There was no evidence of                            diverticular bleeding.                           - Patent end-to-side colo-colonic anastomosis c/w                            partial transverse colectomy.                           - Non-bleeding Gd IV internal hemorrhoids.                           - Otherwise normal colonoscopy to TI. Recommendation:           - Patient has a contact number available for                            emergencies. The signs and symptoms of potential                            delayed  complications were discussed with the                            patient. Return to normal activities tomorrow.                            Written discharge instructions were provided to the                            patient.                           - Return patient to hospital ward for ongoing care.                           - Resume previous diet.                           - Continue present medications.                           - Perform a CT scan (computed tomography) of                            abdomen with contrast and pelvis with contrast                            today (ordered)                           - For hemorrhoids, use hydrocortisone 2.5% bid x 14                            days. Sitz baths BID. If still with problems, would                            recommend surgical hemorrhoidectomy.                           - FU in GI clinic in 6-8 weeks.                           - D/W Sharyn Lull (Pt's niece). Procedure Code(s):        --- Professional ---                           [phone],  Colonoscopy, flexible; diagnostic, including                            collection of specimen(s) by brushing or washing,                            when performed (separate procedure) Diagnosis Code(s):        --- Professional ---  K64.3, Fourth degree hemorrhoids                           Z98.0, Intestinal bypass and anastomosis status                           K62.5, Hemorrhage of anus and rectum                           K57.30, Diverticulosis of large intestine without                            perforation or abscess without bleeding CPT copyright 2019 American Medical Association. All rights reserved. The codes documented in this report are preliminary and upon coder review may  be revised to meet current compliance requirements. Jackquline Denmark, MD 08/15/2019 4:50:37 PM This report has been signed electronically. Number of Addenda: 0

## 2019-08-15 NOTE — Transfer of Care (Signed)
Immediate Anesthesia Transfer of Care Note  Patient: Maurice Vang  Procedure(s) Performed: COLONOSCOPY WITH PROPOFOL (N/A )  Patient Location: Endoscopy Unit  Anesthesia Type:MAC  Level of Consciousness: awake and patient cooperative  Airway & Oxygen Therapy: Patient Spontanous Breathing  Post-op Assessment: Report given to RN and Post -op Vital signs reviewed and stable  Post vital signs: Reviewed and stable  Last Vitals:  Vitals Value Taken Time  BP    Temp    Pulse    Resp    SpO2      Last Pain:  Vitals:   08/15/19 1515  TempSrc: Temporal  PainSc: 0-No pain      Patients Stated Pain Goal: 2 (37/90/24 0973)  Complications: No apparent anesthesia complications

## 2019-08-16 ENCOUNTER — Encounter (HOSPITAL_COMMUNITY): Payer: Self-pay | Admitting: Radiology

## 2019-08-16 ENCOUNTER — Inpatient Hospital Stay (HOSPITAL_COMMUNITY): Payer: Medicare Other

## 2019-08-16 DIAGNOSIS — R195 Other fecal abnormalities: Secondary | ICD-10-CM

## 2019-08-16 MED ORDER — IOHEXOL 300 MG/ML  SOLN
100.0000 mL | Freq: Once | INTRAMUSCULAR | Status: AC | PRN
  Administered 2019-08-16: 100 mL via INTRAVENOUS

## 2019-08-16 MED ORDER — HYDROCORTISONE ACETATE 25 MG RE SUPP: 25.0000 mg | Freq: Two times a day (BID) | RECTAL | 1 refills | Status: DC

## 2019-08-16 MED ORDER — ASPIRIN 81 MG PO TABS: 81.0000 mg | ORAL_TABLET | Freq: Every day | ORAL | 0 refills | Status: DC

## 2019-08-16 MED ORDER — FERROUS SULFATE 325 (65 FE) MG PO TABS: 325.0000 mg | ORAL_TABLET | Freq: Every day | ORAL | 3 refills | Status: AC

## 2019-08-16 MED ORDER — POTASSIUM CHLORIDE CRYS ER 20 MEQ PO TBCR
40.0000 meq | EXTENDED_RELEASE_TABLET | Freq: Three times a day (TID) | ORAL | Status: DC
  Filled 2019-08-16: qty 2

## 2019-08-16 NOTE — Progress Notes (Addendum)
Daily Rounding Note  08/16/2019, 12:23 PM  LOS: 4 days   SUBJECTIVE:   Chief complaint: Hematochezia    No further bleeding.  No stools today.  Feels okay and ready to go home.  OBJECTIVE:         Vital signs in last 24 hours:    Temp:  [97.2 F (36.2 C)-98.7 F (37.1 C)] 98.7 F (37.1 C) (10/08 0950) Pulse Rate:  [71-91] 71 (10/08 0950) Resp:  [17-26] 18 (10/08 0950) BP: (91-174)/(51-81) 107/64 (10/08 0950) SpO2:  [97 %-100 %] 100 % (10/08 0950) Weight:  [100.5 kg] 100.5 kg (10/07 2103) Last BM Date: 08/15/19 Filed Weights   08/13/19 2031 08/14/19 2155 08/15/19 2103  Weight: 100.4 kg 100.5 kg 100.5 kg   General: Looks well, obese.  Comfortable. Heart: RRR. Chest: Clear bilaterally.  No labored breathing. Abdomen: Nontender, obese, soft.  Active bowel sounds. Extremities: No CCE. Neuro/Psych: Oriented x3.  No gross deficits.  Fluid speech.  Intake/Output from previous day: 10/07 0701 - 10/08 0700 In: 2635.8 [P.O.:240; I.V.:2395.8] Out: 2500 [Urine:2500]  Intake/Output this shift: Total I/O In: 200 [P.O.:200] Out: -   Lab Results: Recent Labs    08/13/19 2024 08/14/19 0736 08/15/19 0348  WBC  --  7.3 7.0  HGB 8.2* 8.2* 7.5*  HCT 25.3* 24.4* 23.5*  PLT  --  309 321   BMET Recent Labs    08/14/19 0736 08/15/19 0348  NA 138 139  K 3.4* 3.1*  CL 109 111  CO2 22 22  GLUCOSE 97 102*  BUN 14 8  CREATININE 1.16 1.16  CALCIUM 8.4* 8.5*   LFT No results for input(s): PROT, ALBUMIN, AST, ALT, ALKPHOS, BILITOT, BILIDIR, IBILI in the last 72 hours. PT/INR No results for input(s): LABPROT, INR in the last 72 hours. Hepatitis Panel No results for input(s): HEPBSAG, HCVAB, HEPAIGM, HEPBIGM in the last 72 hours.  Studies/Results: Ct Abdomen Pelvis W Contrast  Result Date: 08/16/2019 CLINICAL DATA:  Diarrhea with bloody stool EXAM: CT ABDOMEN AND PELVIS WITH CONTRAST TECHNIQUE: Multidetector CT  imaging of the abdomen and pelvis was performed using the standard protocol following bolus administration of intravenous contrast. CONTRAST:  166mL OMNIPAQUE IOHEXOL 300 MG/ML  SOLN COMPARISON:  CT 12/23/2017 FINDINGS: Lower chest: Lung bases demonstrate no acute consolidation or pleural effusion. Heart size within normal limits. Small hiatal hernia. Hepatobiliary: Multiple calcified gallstones. No biliary dilatation. No focal hepatic abnormality Pancreas: Unremarkable. No pancreatic ductal dilatation or surrounding inflammatory changes. Spleen: Normal in size without focal abnormality. Adrenals/Urinary Tract: Adrenal glands are unremarkable. Kidneys are normal, without renal calculi, focal lesion, or hydronephrosis. Bladder is unremarkable. Stomach/Bowel: The stomach is nonenlarged. No dilated small bowel. Postsurgical changes at the proximal transverse colon. Negative appendix. Diffuse diverticular disease of the colon. Suspicion of subtle fat stranding adjacent to the descending/sigmoid colon though without significant wall thickening. Vascular/Lymphatic: Mild aortic atherosclerosis. No aneurysm. No significant adenopathy Reproductive: Status post prostatectomy. Other: Negative for free air or free fluid Musculoskeletal: Degenerative changes without acute or suspicious osseous abnormality IMPRESSION: 1. Diffuse diverticular disease of the colon with suspicion of mild fat stranding at the distal descending/sigmoid colon as may be seen with mild acute diverticulitis. No appreciable wall thickening. Negative for perforation or abscess. 2. Gallstones Electronically Signed   By: Donavan Foil M.D.   On: 08/16/2019 03:09    Scheduled Meds:  pantoprazole  40 mg Oral Daily   potassium chloride  40 mEq Oral  TID   sodium chloride flush  3 mL Intravenous Once   Continuous Infusions:  sodium chloride 100 mL/hr at 08/14/19 1849   sodium chloride     PRN Meds:.acetaminophen **OR** acetaminophen, hydrALAZINE,  ondansetron **OR** ondansetron (ZOFRAN) IV   ASSESMENT:   *    Rectal bleeding Remote partial colon resection for complicated diverticulitis 08/14/2019 EGD.  Hiatal hernia.  Mild, nonbleeding gastritis. 08/15/2019 colonoscopy: Diverticulosis throughout, most significant in sigmoid colon colo--colonic anastomosis patent, benign.  Nonbleeding internal hemorrhoids.  No active bleeding or stigmata of recent bleeding. 08/16/2019 CTAP with contrast:   Diffuse colonic diverticulosis suspicion of mild fat stranding at distal descending/sigmoid colon as might be seen with mild, acute diverticulitis.  Gallstones.  *     Transfusion, 2 PRBC, requiring anemia.  Hemoglobins 6.5 >> 7.5  PLAN   *    GI work-up is complete.   Waiting on pathology from the EGD, if H. pylori positive would need antibiotics added to the PPI.  *    Continue daily PPI.  *    Follow-up Hgb at his primary care office, Dr. Delfina Redwood.  No need for GI follow-up other than routine screening colonoscopies, chart every 10 years in his case since there is no family or personal history of colon polyps or cancer.      Azucena Freed  08/16/2019, 12:23 PM Phone [phone]    Attending physician's note   I have reviewed the chart. I agree with the Advanced Practitioner's note, impression and recommendations.   No further bleeding. Hb stable.  Has IDA. EGD-small hiatal hernia. Bx -pending. Colonoscopy-pancolonic diverticulosis predominantly in the sigmoid colon.  Grade 4 internal hemorrhoids CT AP -negative for any acute abnormalities.  The sigmoid wall thickening is likely due to diverticular disease.  No diverticulitis clinically.  WBC count is normal.  Plan: -Follow-up in the GI clinic in 6 to 8 weeks. -Fiber supplements -Hydrocortisone/sitz bath's for hemorrhoids. -D/w Dr. Tammi Klippel.  Carmell Austria, MD [facility] [phone].

## 2019-08-16 NOTE — Discharge Summary (Signed)
Physician Discharge Summary  Maurice Vang [id number] DOB: 13-Sep-1946 DOA: 08/12/2019  PCP: Seward Carol, MD  Admit date: 08/12/2019 Discharge date: 08/16/2019  Admitted From: Home Disposition:  Home  Recommendations for Outpatient Follow-up:  1. Follow up with GI in 1-2 weeks 2. Please obtain BMP/CBC in one week   Home Health:No Equipment/Devices:None  Discharge Condition: Stable CODE STATUS:Full Diet recommendation: Heart Healthy  Brief/Interim Summary: 73 y.o. male past medical history of essential hypertension, prostate cancer who presents with exertional dyspnea found to have a GI bleed and acute blood loss anemia.  Discharge Diagnoses:  Active Problems:   Anemia associated with acute blood loss   Hypertension   History of prostate cancer   Symptomatic anemia   Fecal occult blood test positive  GI bleed/anemia associated with acute blood loss: Underwent EGD on 08/14/2019 that showed no signs of bleeding, colonoscopy in 08/15/2019 that showed diverticulosis no source of bleeding, he is status post 2 units of packed red blood cells, anemia panel was done that showed a ferritin of 8 he was started on ferrous sulfate 3 times daily.  He will continue to hold the aspirin until 06/28/2019.  Acute kidney injury: Likely prerenal azotemia resolved with IV fluid hydration.  Essential hypertension: Blood pressure stable no med changes made to his medication.  Discharge Instructions  Discharge Instructions    Diet - low sodium heart healthy   Complete by: As directed    Increase activity slowly   Complete by: As directed      Allergies as of 08/16/2019      Reactions   Codeine Nausea And Vomiting      Medication List    TAKE these medications   aspirin 81 MG tablet Take 1 tablet (81 mg total) by mouth daily. Start taking on: August 19, 2019 What changed: These instructions start on August 19, 2019. If you are unsure what to do until then, ask your doctor or  other care provider.   [employer] VIT D 5000 HIGH-POTENCY PO Take 1 tablet by mouth daily.   ferrous sulfate 325 (65 FE) MG tablet Take 1 tablet (325 mg total) by mouth daily.   hydrochlorothiazide 25 MG tablet Commonly known as: HYDRODIURIL Take 25 mg by mouth daily.   hydrocortisone 25 MG suppository Commonly known as: ANUSOL-HC Place 1 suppository (25 mg total) rectally every 12 (twelve) hours.   ramipril 5 MG capsule Commonly known as: ALTACE Take 5 mg by mouth daily.       Allergies  Allergen Reactions  . Codeine Nausea And Vomiting    Consultations:  GI   Procedures/Studies: Dg Chest 2 View  Result Date: 08/12/2019 CLINICAL DATA:  Chest pain and shortness of breath 1 week. EXAM: CHEST - 2 VIEW COMPARISON:  08/09/2019 FINDINGS: Lungs are adequately inflated and clear. Flattening of the hemidiaphragms on the lateral film. Cardiomediastinal silhouette and remainder of the exam is unchanged. IMPRESSION: No active cardiopulmonary disease. Electronically Signed   By: Marin Olp M.D.   On: 08/12/2019 13:20   Dg Chest 2 View  Result Date: 08/09/2019 CLINICAL DATA:  Shortness of breath EXAM: CHEST - 2 VIEW COMPARISON:  None. FINDINGS: The heart size and mediastinal contours are within normal limits. Both lungs are clear. The visualized skeletal structures are unremarkable. IMPRESSION: No acute abnormality of the lungs. Electronically Signed   By: Eddie Candle M.D.   On: 08/09/2019 10:21   Ct Abdomen Pelvis W Contrast  Result Date: 08/16/2019 CLINICAL DATA:  Diarrhea with  bloody stool EXAM: CT ABDOMEN AND PELVIS WITH CONTRAST TECHNIQUE: Multidetector CT imaging of the abdomen and pelvis was performed using the standard protocol following bolus administration of intravenous contrast. CONTRAST:  111mL OMNIPAQUE IOHEXOL 300 MG/ML  SOLN COMPARISON:  CT 12/23/2017 FINDINGS: Lower chest: Lung bases demonstrate no acute consolidation or pleural effusion. Heart size within normal limits.  Small hiatal hernia. Hepatobiliary: Multiple calcified gallstones. No biliary dilatation. No focal hepatic abnormality Pancreas: Unremarkable. No pancreatic ductal dilatation or surrounding inflammatory changes. Spleen: Normal in size without focal abnormality. Adrenals/Urinary Tract: Adrenal glands are unremarkable. Kidneys are normal, without renal calculi, focal lesion, or hydronephrosis. Bladder is unremarkable. Stomach/Bowel: The stomach is nonenlarged. No dilated small bowel. Postsurgical changes at the proximal transverse colon. Negative appendix. Diffuse diverticular disease of the colon. Suspicion of subtle fat stranding adjacent to the descending/sigmoid colon though without significant wall thickening. Vascular/Lymphatic: Mild aortic atherosclerosis. No aneurysm. No significant adenopathy Reproductive: Status post prostatectomy. Other: Negative for free air or free fluid Musculoskeletal: Degenerative changes without acute or suspicious osseous abnormality IMPRESSION: 1. Diffuse diverticular disease of the colon with suspicion of mild fat stranding at the distal descending/sigmoid colon as may be seen with mild acute diverticulitis. No appreciable wall thickening. Negative for perforation or abscess. 2. Gallstones Electronically Signed   By: Donavan Foil M.D.   On: 08/16/2019 03:09   (Echo, Carotid, EGD, Colonoscopy, ERCP)    Subjective:  Feels great no complains Discharge Exam: Vitals:   08/16/19 0900 08/16/19 0950  BP: (!) 113/57 107/64  Pulse: 91 71  Resp: 18 18  Temp: 98.5 F (36.9 C) 98.7 F (37.1 C)  SpO2: 99% 100%   Vitals:   08/15/19 2103 08/16/19 0526 08/16/19 0900 08/16/19 0950  BP: 134/70 (!) 91/51 (!) 113/57 107/64  Pulse: 76 78 91 71  Resp: 17 18 18 18   Temp: 98.5 F (36.9 C) 98.7 F (37.1 C) 98.5 F (36.9 C) 98.7 F (37.1 C)  TempSrc: Oral Oral Oral Oral  SpO2: 100% 100% 99% 100%  Weight: 100.5 kg     Height:        General: Pt is alert, awake, not in acute  distress Cardiovascular: RRR, S1/S2 +, no rubs, no gallops Respiratory: CTA bilaterally, no wheezing, no rhonchi Abdominal: Soft, NT, ND, bowel sounds + Extremities: no edema, no cyanosis    The results of significant diagnostics from this hospitalization (including imaging, microbiology, ancillary and laboratory) are listed below for reference.     Microbiology: Recent Results (from the past 240 hour(s))  Novel Coronavirus, NAA (Hosp order, Send-out to Ref Lab; TAT 18-24 hrs     Status: None   Collection Time: 08/09/19  9:49 AM   Specimen: Nasopharyngeal Swab; Respiratory  Result Value Ref Range Status   SARS-CoV-2, NAA NOT DETECTED NOT DETECTED Final    Comment: (NOTE) This nucleic acid amplification test was developed and its performance characteristics determined by [employer]. Nucleic acid amplification tests include PCR and TMA. This test has not been FDA cleared or approved. This test has been authorized by FDA under an Emergency Use Authorization (EUA). This test is only authorized for the duration of time the declaration that circumstances exist justifying the authorization of the emergency use of in vitro diagnostic tests for detection of SARS-CoV-2 virus and/or diagnosis of COVID-19 infection under section 564(b)(1) of the Act, 21 U.S.C. [id number]) (1), unless the authorization is terminated or revoked sooner. When diagnostic testing is negative, the possibility of a false negative result should  be considered in the context of a patient's recent exposures and the presence of clinical signs and symptoms consistent with COVID-19. An individual without symptoms of COVID- 19 and who is not shedding SARS-CoV-2 vi rus would expect to have a negative (not detected) result in this assay. Performed At: [facility] [address] [place], [place] [id number] Rush Farmer MD [id number]    [place]  Final    Comment: Performed  at [facility], [place] [address] [place], [place] [address]  SARS CORONAVIRUS 2 (TAT 6-24 HRS) Nasopharyngeal Nasopharyngeal Swab     Status: None   Collection Time: 08/12/19  4:20 PM   Specimen: Nasopharyngeal Swab  Result Value Ref Range Status   SARS Coronavirus 2 NEGATIVE NEGATIVE Final    Comment: (NOTE) SARS-CoV-2 target nucleic acids are NOT DETECTED. The SARS-CoV-2 RNA is generally detectable in upper and lower respiratory specimens during the acute phase of infection. Negative results do not preclude SARS-CoV-2 infection, do not rule out co-infections with other pathogens, and should not be used as the sole basis for treatment or other patient management decisions. Negative results must be combined with clinical observations, patient history, and epidemiological information. The expected result is Negative. Fact Sheet for Patients: SugarRoll.be Fact Sheet for Healthcare Providers: https://www.woods-mathews.com/ This test is not yet approved or cleared by the [place] FDA and  has been authorized for detection and/or diagnosis of SARS-CoV-2 by FDA under an Emergency Use Authorization (EUA). This EUA will remain  in effect (meaning this test can be used) for the duration of the COVID-19 declaration under Section 56 4(b)(1) of the Act, 21 U.S.C. section 360bbb-3(b)(1), unless the authorization is terminated or revoked sooner. Performed at [facility], [place] [address], [place], [place] [address]      Labs: BNP (last 3 results) No results for input(s): BNP in the last 8760 hours. Basic Metabolic Panel: Recent Labs  Lab 08/12/19 1227 08/13/19 0526 08/14/19 0736 08/15/19 0348  NA 136 136 138 139  K 3.5 3.4* 3.4* 3.1*  CL 102 105 109 111  CO2 22 22 22 22   GLUCOSE 123* 98 97 102*  BUN 20 15 14 8   CREATININE 1.41* 1.26* 1.16 1.16  CALCIUM 9.5 9.0 8.4* 8.5*   Liver Function Tests: No results for input(s): AST,  ALT, ALKPHOS, BILITOT, PROT, ALBUMIN in the last 168 hours. No results for input(s): LIPASE, AMYLASE in the last 168 hours. No results for input(s): AMMONIA in the last 168 hours. CBC: Recent Labs  Lab 08/12/19 1227 08/12/19 1405 08/13/19 0526 08/13/19 2024 08/14/19 0736 08/15/19 0348  WBC 7.4 8.5 7.3  --  7.3 7.0  NEUTROABS  --  6.2  --   --   --   --   HGB 8.4* 8.5* 6.5* 8.2* 8.2* 7.5*  HCT 26.6* 27.1* 20.3* 25.3* 24.4* 23.5*  MCV 88.1 87.7 86.0  --  85.0 86.4  PLT 364 375 306  --  309 321   Cardiac Enzymes: No results for input(s): CKTOTAL, CKMB, CKMBINDEX, TROPONINI in the last 168 hours. BNP: Invalid input(s): POCBNP CBG: No results for input(s): GLUCAP in the last 168 hours. D-Dimer No results for input(s): DDIMER in the last 72 hours. Hgb A1c No results for input(s): HGBA1C in the last 72 hours. Lipid Profile No results for input(s): CHOL, HDL, LDLCALC, TRIG, CHOLHDL, LDLDIRECT in the last 72 hours. Thyroid function studies No results for input(s): TSH, T4TOTAL, T3FREE, THYROIDAB in the last 72 hours.  Invalid input(s): FREET3 Anemia work up No results for input(s): VITAMINB12, FOLATE, FERRITIN, TIBC, IRON, RETICCTPCT in the last 72 hours. Urinalysis    Component Value Date/Time   COLORURINE YELLOW 08/12/2019 1620   APPEARANCEUR CLEAR 08/12/2019 1620   LABSPEC 1.018 08/12/2019 1620   PHURINE 5.0 08/12/2019 1620   GLUCOSEU NEGATIVE 08/12/2019 1620   HGBUR NEGATIVE 08/12/2019 1620   BILIRUBINUR NEGATIVE 08/12/2019 1620   KETONESUR NEGATIVE 08/12/2019 1620   PROTEINUR NEGATIVE 08/12/2019 1620   NITRITE NEGATIVE 08/12/2019 1620   LEUKOCYTESUR NEGATIVE 08/12/2019 1620   Sepsis Labs Invalid input(s): PROCALCITONIN,  WBC,  LACTICIDVEN Microbiology Recent Results (from the past 240 hour(s))  Novel Coronavirus, NAA (Hosp order, Send-out to Ref Lab; TAT 18-24 hrs     Status: None   Collection Time: 08/09/19  9:49 AM   Specimen: Nasopharyngeal Swab; Respiratory   Result Value Ref Range Status   SARS-CoV-2, NAA NOT DETECTED NOT DETECTED Final    Comment: (NOTE) This nucleic acid amplification test was developed and its performance characteristics determined by [employer]. Nucleic acid amplification tests include PCR and TMA. This test has not been FDA cleared or approved. This test has been authorized by FDA under an Emergency Use Authorization (EUA). This test is only authorized for the duration of time the declaration that circumstances exist justifying the authorization of the emergency use of in vitro diagnostic tests for detection of SARS-CoV-2 virus and/or diagnosis of COVID-19 infection under section 564(b)(1) of the Act, 21 U.S.C. [id number]) (1), unless the authorization is terminated or revoked sooner. When diagnostic testing is negative, the possibility of a false negative result should be considered in the context of a patient's recent exposures and the presence of clinical signs and symptoms consistent with COVID-19. An individual without symptoms of COVID- 19 and who is not shedding SARS-CoV-2 vi rus would expect to have a negative (not detected) result in this assay. Performed At: [facility] [address] [place], [place] [id number] Rush Farmer MD [id number]    [place]  Final    Comment: Performed at [facility], [place] [address], [place], [place] [address]  SARS CORONAVIRUS 2 (TAT 6-24 HRS) Nasopharyngeal Nasopharyngeal Swab     Status: None   Collection Time: 08/12/19  4:20 PM   Specimen: Nasopharyngeal Swab  Result Value Ref Range Status   SARS Coronavirus 2 NEGATIVE NEGATIVE Final    Comment: (NOTE) SARS-CoV-2 target nucleic acids are NOT DETECTED. The SARS-CoV-2 RNA is generally detectable in upper and lower respiratory specimens during the acute phase of infection. Negative results do not preclude SARS-CoV-2 infection, do not rule out co-infections with other  pathogens, and should not be used as the sole basis for treatment or other patient management decisions. Negative results must be combined with clinical observations, patient history, and epidemiological information. The expected result is Negative. Fact Sheet for Patients: SugarRoll.be Fact Sheet for Healthcare Providers: https://www.woods-mathews.com/ This test is not yet approved or cleared by the [place] FDA and  has been authorized for detection and/or diagnosis of SARS-CoV-2 by FDA under an Emergency Use Authorization (EUA). This EUA will remain  in effect (meaning this test can be used) for the duration of the COVID-19 declaration under Section 56 4(b)(1) of the Act, 21 U.S.C. section 360bbb-3(b)(1), unless the authorization is terminated or revoked sooner. Performed at [facility], [place] [address], [place], [place] [address]      Time coordinating discharge: Over 40 minutes  SIGNED:  Charlynne Cousins, MD  [facility] 08/16/2019, 2:02 PM Pager   If 7PM-7AM, please contact night-coverage [email or web address] Password [email or web address]

## 2019-08-16 NOTE — Care Management Important Message (Signed)
Important Message  Patient Details  Name: Maurice Vang MRN: [id number] Date of Birth: [birth year]   Medicare Important Message Given:  Yes     Orbie Pyo 08/16/2019, 7:42 AM

## 2019-08-16 NOTE — Progress Notes (Signed)
Rush Barer to be discharged Home  per MD order. Discussed prescriptions and follow up appointments with the patient. Prescriptions given to patient; medication list explained in detail. Patient verbalized understanding.  Skin clean, dry and intact without evidence of skin break down, no evidence of skin tears noted. IV catheter discontinued intact. Site without signs and symptoms of complications. Dressing and pressure applied. Pt denies pain at the site currently. No complaints noted.  Patient free of lines, drains, and wounds.   An After Visit Summary (AVS) was printed and given to the patient. Patient escorted via wheelchair, and discharged home via private auto.  Shela Commons, RN

## 2019-08-17 ENCOUNTER — Encounter: Payer: Self-pay | Admitting: Gastroenterology

## 2019-08-17 LAB — SURGICAL PATHOLOGY

## 2019-08-21 ENCOUNTER — Ambulatory Visit (INDEPENDENT_AMBULATORY_CARE_PROVIDER_SITE_OTHER): Payer: Medicare Other | Admitting: Gastroenterology

## 2019-08-21 ENCOUNTER — Encounter: Payer: Self-pay | Admitting: Gastroenterology

## 2019-08-21 ENCOUNTER — Other Ambulatory Visit: Payer: Self-pay

## 2019-08-21 ENCOUNTER — Other Ambulatory Visit (INDEPENDENT_AMBULATORY_CARE_PROVIDER_SITE_OTHER): Payer: Medicare Other

## 2019-08-21 VITALS — BP 116/72 | HR 78 | Temp 98.6°F | Ht 71.5 in | Wt 240.4 lb

## 2019-08-21 DIAGNOSIS — Z8719 Personal history of other diseases of the digestive system: Secondary | ICD-10-CM

## 2019-08-21 DIAGNOSIS — D509 Iron deficiency anemia, unspecified: Secondary | ICD-10-CM

## 2019-08-21 LAB — COMPREHENSIVE METABOLIC PANEL
ALT: 11 U/L (ref 0–53)
AST: 12 U/L (ref 0–37)
Albumin: 3.8 g/dL (ref 3.5–5.2)
Alkaline Phosphatase: 31 U/L — ABNORMAL LOW (ref 39–117)
BUN: 14 mg/dL (ref 6–23)
CO2: 26 mEq/L (ref 19–32)
Calcium: 9.3 mg/dL (ref 8.4–10.5)
Chloride: 109 mEq/L (ref 96–112)
Creatinine, Ser: 1.14 mg/dL (ref 0.40–1.50)
GFR: 76.38 mL/min (ref >60.00)
Glucose, Bld: 92 mg/dL (ref 70–99)
Potassium: 3.5 mEq/L (ref 3.5–5.1)
Sodium: 142 mEq/L (ref 135–145)
Total Bilirubin: 0.6 mg/dL (ref 0.2–1.2)
Total Protein: 6.6 g/dL (ref 6.0–8.3)

## 2019-08-21 LAB — CBC WITH DIFFERENTIAL/PLATELET
Basophils Absolute: 0 10*3/uL (ref 0.0–0.1)
Basophils Relative: 0.3 % (ref 0.0–3.0)
Eosinophils Absolute: 0.3 10*3/uL (ref 0.0–0.7)
Eosinophils Relative: 5.2 % — ABNORMAL HIGH (ref 0.0–5.0)
HCT: 25.6 % — ABNORMAL LOW (ref 39.0–52.0)
Hemoglobin: 8.6 g/dL — ABNORMAL LOW (ref 13.0–17.0)
Lymphocytes Relative: 20.6 % (ref 12.0–46.0)
Lymphs Abs: 1.3 10*3/uL (ref 0.7–4.0)
MCHC: 33.3 g/dL (ref 30.0–36.0)
MCV: 83.8 fl (ref 78.0–100.0)
Monocytes Absolute: 0.5 10*3/uL (ref 0.1–1.0)
Monocytes Relative: 8.6 % (ref 3.0–12.0)
Neutro Abs: 4.1 10*3/uL (ref 1.4–7.7)
Neutrophils Relative %: 65.3 % (ref 43.0–77.0)
Platelets: 356 10*3/uL (ref 150.0–400.0)
RBC: 3.06 Mil/uL — ABNORMAL LOW (ref 4.22–5.81)
RDW: 15.8 % — ABNORMAL HIGH (ref 11.5–15.5)
WBC: 6.3 10*3/uL (ref 4.0–10.5)

## 2019-08-21 NOTE — Progress Notes (Signed)
Chief Complaint: FU  Referring Provider:  Seward Carol, MD      ASSESSMENT AND PLAN;   #1. IDA - neg GI WU.  Could be due to hemorrhoids. S/P IV ferraheme 08/2019  #2. H/O LGI bleed. Neg colon 08/15/2019 except for pancolonic diverticulosis, grade 4 internal hemorrhoids. Neg EGD 08/2019 except for 2 cm hiatal hernia. Neg CT A/P 08/2019   Plan: - CBC, CMP, iron studies. - Appt with Dr Delfina Redwood. - FU in 6 months. - Call if any problems. - Copy of CT given to the patient.  I also discussed above with the patient and patient's son. - FU in 6 mts.  Earlier, if with any new problems. -He is not interested in hemorrhoidectomy.  He has been using hydrocortisone suppositories with good results.  I also discussed regarding sitz baths  HPI:    Maurice Vang is a 73 y.o. male  For follow-up visit  No GI complaints  No nausea, vomiting, heartburn, regurgitation, odynophagia or dysphagia.  No significant diarrhea or constipation.  There is no melena or hematochezia. No unintentional weight loss.  Does feel weak and tired.  Accompanied by his son.  Unfortunately he lost his wife to metastatic colon cancer.  Niece Investment banker, corporate) lives in [place].  She is very involved with the patient's care.   Past Medical History:  Diagnosis Date  . Cancer [facility])    prostate  . Hypertension     Past Surgical History:  Procedure Laterality Date  . BIOPSY  08/14/2019   Procedure: BIOPSY;  Surgeon: Jackquline Denmark, MD;  Location: [place];  Service: Endoscopy;;  . COLONOSCOPY WITH PROPOFOL N/A 08/15/2019   Procedure: COLONOSCOPY WITH PROPOFOL;  Surgeon: Jackquline Denmark, MD;  Location: [facility] ENDOSCOPY;  Service: Endoscopy;  Laterality: N/A;  . ESOPHAGOGASTRODUODENOSCOPY (EGD) WITH PROPOFOL N/A 08/14/2019   Procedure: ESOPHAGOGASTRODUODENOSCOPY (EGD) WITH PROPOFOL;  Surgeon: Jackquline Denmark, MD;  Location: [facility] ENDOSCOPY;  Service: Endoscopy;  Laterality: N/A;  . prostate cancer    . PROSTATE SURGERY    . SMALL  INTESTINE SURGERY      Family History  Problem Relation Age of Onset  . Stroke Mother   . Stroke Father   . Colon cancer Neg Hx   . Esophageal cancer Neg Hx     Social History   Tobacco Use  . Smoking status: Former Smoker    Quit date: 1990    Years since quitting: 30.8  . Smokeless tobacco: Never Used  Substance Use Topics  . Alcohol use: No  . Drug use: No    Current Outpatient Medications  Medication Sig Dispense Refill  . ALPRAZolam (XANAX) 0.25 MG tablet 1 tablet daily.    . Cholecalciferol ([employer] VIT D 5000 HIGH-POTENCY PO) Take 1 tablet by mouth daily.    . ferrous sulfate 325 (65 FE) MG tablet Take 1 tablet (325 mg total) by mouth daily. 30 tablet 3  . hydrochlorothiazide (HYDRODIURIL) 25 MG tablet Take 25 mg by mouth daily.    . hydrocortisone (ANUSOL-HC) 25 MG suppository Place 1 suppository (25 mg total) rectally every 12 (twelve) hours. 12 suppository 1  . ramipril (ALTACE) 5 MG capsule Take 5 mg by mouth daily.     No current facility-administered medications for this visit.     Allergies  Allergen Reactions  . Codeine Nausea And Vomiting    Review of Systems:  neg     Physical Exam:    BP 116/72   Pulse 78   Temp  98.6 F (37 C)   Ht 5' 11.5" (1.816 m)   Wt 240 lb 6 oz (109 kg)   BMI 33.06 kg/m  Filed Weights   08/21/19 1413  Weight: 240 lb 6 oz (109 kg)   Constitutional:  Well-developed, in no acute distress. Psychiatric: Normal mood and affect. Behavior is normal. HEENT: Pupils normal.  Conjunctivae are normal. No scleral icterus. Neck supple.  Cardiovascular: Normal rate, regular rhythm. No edema Pulmonary/chest: Effort normal and breath sounds normal. No wheezing, rales or rhonchi. Abdominal: Soft, nondistended. Nontender. Bowel sounds active throughout. There are no masses palpable. No hepatomegaly. Rectal:  defered Neurological: Alert and oriented to person place and time. Skin: Skin is warm and dry. No rashes noted.  Data  Reviewed: I have personally reviewed following labs and imaging studies  CBC: CBC Latest Ref Rng & Units 08/15/2019 08/14/2019 08/13/2019  WBC 4.0 - 10.5 K/uL 7.0 7.3 -  Hemoglobin 13.0 - 17.0 g/dL 7.5(L) 8.2(L) 8.2(L)  Hematocrit 39.0 - 52.0 % 23.5(L) 24.4(L) 25.3(L)  Platelets 150 - 400 K/uL 321 309 -    CMP: CMP Latest Ref Rng & Units 08/15/2019 08/14/2019 08/13/2019  Glucose 70 - 99 mg/dL 102(H) 97 98  BUN 8 - 23 mg/dL 8 14 15   Creatinine 0.61 - 1.24 mg/dL 1.16 1.16 1.26(H)  Sodium 135 - 145 mmol/L 139 138 136  Potassium 3.5 - 5.1 mmol/L 3.1(L) 3.4(L) 3.4(L)  Chloride 98 - 111 mmol/L 111 109 105  CO2 22 - 32 mmol/L 22 22 22   Calcium 8.9 - 10.3 mg/dL 8.5(L) 8.4(L) 9.0     Recent Results (from the past 240 hour(s))  SARS CORONAVIRUS 2 (TAT 6-24 HRS) Nasopharyngeal Nasopharyngeal Swab     Status: None   Collection Time: 08/12/19  4:20 PM   Specimen: Nasopharyngeal Swab  Result Value Ref Range Status   SARS Coronavirus 2 NEGATIVE NEGATIVE Final    Comment: (NOTE) SARS-CoV-2 target nucleic acids are NOT DETECTED. The SARS-CoV-2 RNA is generally detectable in upper and lower respiratory specimens during the acute phase of infection. Negative results do not preclude SARS-CoV-2 infection, do not rule out co-infections with other pathogens, and should not be used as the sole basis for treatment or other patient management decisions. Negative results must be combined with clinical observations, patient history, and epidemiological information. The expected result is Negative. Fact Sheet for Patients: SugarRoll.be Fact Sheet for Healthcare Providers: https://www.woods-mathews.com/ This test is not yet approved or cleared by the [place] FDA and  has been authorized for detection and/or diagnosis of SARS-CoV-2 by FDA under an Emergency Use Authorization (EUA). This EUA will remain  in effect (meaning this test can be used) for the duration  of the COVID-19 declaration under Section 56 4(b)(1) of the Act, 21 U.S.C. section 360bbb-3(b)(1), unless the authorization is terminated or revoked sooner. Performed at [facility], [place] [address] [place], [place] [address]       Radiology Studies: Dg Chest 2 View  Result Date: 08/12/2019 CLINICAL DATA:  Chest pain and shortness of breath 1 week. EXAM: CHEST - 2 VIEW COMPARISON:  08/09/2019 FINDINGS: Lungs are adequately inflated and clear. Flattening of the hemidiaphragms on the lateral film. Cardiomediastinal silhouette and remainder of the exam is unchanged. IMPRESSION: No active cardiopulmonary disease. Electronically Signed   By: Marin Olp M.D.   On: 08/12/2019 13:20   Dg Chest 2 View  Result Date: 08/09/2019 CLINICAL DATA:  Shortness of breath EXAM: CHEST - 2 VIEW COMPARISON:  None. FINDINGS: The heart size and mediastinal contours are within normal limits. Both lungs are clear. The visualized skeletal structures are unremarkable. IMPRESSION: No acute abnormality of the lungs. Electronically Signed   By: Eddie Candle M.D.   On: 08/09/2019 10:21   Ct Abdomen Pelvis W Contrast  Result Date: 08/16/2019 CLINICAL DATA:  Diarrhea with bloody stool EXAM: CT ABDOMEN AND PELVIS WITH CONTRAST TECHNIQUE: Multidetector CT imaging of the abdomen and pelvis was performed using the standard protocol following bolus administration of intravenous contrast. CONTRAST:  162mL OMNIPAQUE IOHEXOL 300 MG/ML  SOLN COMPARISON:  CT 12/23/2017 FINDINGS: Lower chest: Lung bases demonstrate no acute consolidation or pleural effusion. Heart size within normal limits. Small hiatal hernia. Hepatobiliary: Multiple calcified gallstones. No biliary dilatation. No focal hepatic abnormality Pancreas: Unremarkable. No pancreatic ductal dilatation or surrounding inflammatory changes. Spleen: Normal in size without focal abnormality. Adrenals/Urinary Tract: Adrenal glands are unremarkable. Kidneys are normal,  without renal calculi, focal lesion, or hydronephrosis. Bladder is unremarkable. Stomach/Bowel: The stomach is nonenlarged. No dilated small bowel. Postsurgical changes at the proximal transverse colon. Negative appendix. Diffuse diverticular disease of the colon. Suspicion of subtle fat stranding adjacent to the descending/sigmoid colon though without significant wall thickening. Vascular/Lymphatic: Mild aortic atherosclerosis. No aneurysm. No significant adenopathy Reproductive: Status post prostatectomy. Other: Negative for free air or free fluid Musculoskeletal: Degenerative changes without acute or suspicious osseous abnormality IMPRESSION: 1. Diffuse diverticular disease of the colon with suspicion of mild fat stranding at the distal descending/sigmoid colon as may be seen with mild acute diverticulitis. No appreciable wall thickening. Negative for perforation or abscess. 2. Gallstones Electronically Signed   By: Donavan Foil M.D.   On: 08/16/2019 03:09      Carmell Austria, MD 08/21/2019, 2:31 PM  Cc: Seward Carol, MD

## 2019-08-21 NOTE — Patient Instructions (Signed)
If you are age 72 or older, your body mass index should be between 23-30. Your Body mass index is 33.06 kg/m. If this is out of the aforementioned range listed, please consider follow up with your Primary Care Provider.  If you are age 54 or younger, your body mass index should be between 19-25. Your Body mass index is 33.06 kg/m. If this is out of the aformentioned range listed, please consider follow up with your Primary Care Provider.   Please go to the lab at [facility] ([place] You will need to go to level "B", you do not need an appointment for this. Hours available are 7:30 am - 4:30 pm.   Follow up in 6 months.    Thank you,  Dr. Jackquline Denmark

## 2019-08-22 LAB — IRON, TOTAL/TOTAL IRON BINDING CAP
%SAT: 76 % (calc) — ABNORMAL HIGH (ref 20–48)
Iron: 286 ug/dL — ABNORMAL HIGH (ref 50–180)
TIBC: 378 mcg/dL (calc) (ref 250–425)

## 2019-08-23 ENCOUNTER — Other Ambulatory Visit: Payer: Self-pay

## 2019-08-23 DIAGNOSIS — F4321 Adjustment disorder with depressed mood: Secondary | ICD-10-CM | POA: Diagnosis not present

## 2019-08-23 DIAGNOSIS — D509 Iron deficiency anemia, unspecified: Secondary | ICD-10-CM

## 2019-08-23 DIAGNOSIS — K5791 Diverticulosis of intestine, part unspecified, without perforation or abscess with bleeding: Secondary | ICD-10-CM | POA: Diagnosis not present

## 2019-08-23 DIAGNOSIS — R600 Localized edema: Secondary | ICD-10-CM | POA: Diagnosis not present

## 2019-08-23 DIAGNOSIS — Z8719 Personal history of other diseases of the digestive system: Secondary | ICD-10-CM

## 2019-09-10 DIAGNOSIS — E8770 Fluid overload, unspecified: Secondary | ICD-10-CM | POA: Diagnosis not present

## 2019-09-10 DIAGNOSIS — Z23 Encounter for immunization: Secondary | ICD-10-CM | POA: Diagnosis not present

## 2019-09-10 DIAGNOSIS — K922 Gastrointestinal hemorrhage, unspecified: Secondary | ICD-10-CM | POA: Diagnosis not present

## 2019-09-24 ENCOUNTER — Encounter: Payer: Self-pay | Admitting: Gastroenterology

## 2019-09-24 ENCOUNTER — Ambulatory Visit (INDEPENDENT_AMBULATORY_CARE_PROVIDER_SITE_OTHER): Payer: Medicare Other | Admitting: Gastroenterology

## 2019-09-24 ENCOUNTER — Other Ambulatory Visit: Payer: Self-pay

## 2019-09-24 VITALS — BP 116/70 | HR 83 | Temp 98.7°F | Ht 72.0 in | Wt 229.2 lb

## 2019-09-24 DIAGNOSIS — D509 Iron deficiency anemia, unspecified: Secondary | ICD-10-CM

## 2019-09-24 NOTE — Progress Notes (Signed)
Chief Complaint: FU  Referring Provider:  Seward Carol, MD      ASSESSMENT AND PLAN;   #1. IDA - neg GI WU.  Could be due to hemorrhoids. S/P IV ferraheme 08/2019  #2. H/O LGI bleed. Neg colon 08/15/2019 except for pancolonic diverticulosis, grade 4 internal hemorrhoids. Neg EGD 08/2019 except for 2 cm hiatal hernia. Neg CT A/P 08/2019   Plan: - Had blood tests on Sep 10, 2019 (Dr Delfina Redwood). - Continue high fiber diet. - Continue iron supplements. - Call if any problems. - FU in 12 weeks.  Earlier, if with any new problems. - Not interested in hemorrhoidectomy.  He is not having any problems.  HPI:    Maurice Vang is a 73 y.o. male  For follow-up visit  No GI complaints.  Had blood work including CBC at Dr. Lina Sar office November 2.  Per patient it was good.  I could not find it in epic.  No nausea, vomiting, heartburn, regurgitation, odynophagia or dysphagia.  No significant diarrhea or constipation.  There is no melena or hematochezia. No unintentional weight loss.  Feels much better.  Unfortunately he lost his wife to metastatic colon cancer.  Niece Investment banker, corporate) lives in [place].  She is very involved with the patient's care.   Past Medical History:  Diagnosis Date  . Cancer [facility])    prostate  . Hypertension     Past Surgical History:  Procedure Laterality Date  . BIOPSY  08/14/2019   Procedure: BIOPSY;  Surgeon: Jackquline Denmark, MD;  Location: [place];  Service: Endoscopy;;  . COLONOSCOPY WITH PROPOFOL N/A 08/15/2019   Procedure: COLONOSCOPY WITH PROPOFOL;  Surgeon: Jackquline Denmark, MD;  Location: [facility] ENDOSCOPY;  Service: Endoscopy;  Laterality: N/A;  . ESOPHAGOGASTRODUODENOSCOPY (EGD) WITH PROPOFOL N/A 08/14/2019   Procedure: ESOPHAGOGASTRODUODENOSCOPY (EGD) WITH PROPOFOL;  Surgeon: Jackquline Denmark, MD;  Location: [facility] ENDOSCOPY;  Service: Endoscopy;  Laterality: N/A;  . prostate cancer    . PROSTATE SURGERY    . SMALL INTESTINE SURGERY      Family History   Problem Relation Age of Onset  . Stroke Mother   . Stroke Father   . Colon cancer Neg Hx   . Esophageal cancer Neg Hx     Social History   Tobacco Use  . Smoking status: Former Smoker    Quit date: 1990    Years since quitting: 30.8  . Smokeless tobacco: Never Used  Substance Use Topics  . Alcohol use: No  . Drug use: No    Current Outpatient Medications  Medication Sig Dispense Refill  . ALPRAZolam (XANAX) 0.25 MG tablet 1 tablet daily.    . Cholecalciferol ([employer]S VIT D 5000 HIGH-POTENCY PO) Take 1 tablet by mouth daily.    Marland Kitchen escitalopram (LEXAPRO) 10 MG tablet Take 10 mg by mouth daily.    . ferrous sulfate 325 (65 FE) MG tablet Take 1 tablet (325 mg total) by mouth daily. 30 tablet 3  . hydrochlorothiazide (HYDRODIURIL) 25 MG tablet Take 25 mg by mouth daily.    . ramipril (ALTACE) 5 MG capsule Take 5 mg by mouth daily.     No current facility-administered medications for this visit.     Allergies  Allergen Reactions  . Codeine Nausea And Vomiting    Review of Systems:  neg     Physical Exam:    BP 116/70   Pulse 83   Temp 98.7 F (37.1 C)   Ht 6' (1.829 m)   [facility]  229 lb 4 oz (104 kg)   BMI 31.09 kg/m  Filed Weights   09/24/19 1414  Weight: 229 lb 4 oz (104 kg)   Constitutional:  Well-developed, in no acute distress. Psychiatric: Normal mood and affect. Behavior is normal. HEENT: Pupils normal.  Conjunctivae are normal. No scleral icterus. Neck supple.  Cardiovascular: Normal rate, regular rhythm. No edema Pulmonary/chest: Effort normal and breath sounds normal. No wheezing, rales or rhonchi. Abdominal: Soft, nondistended. Nontender. Bowel sounds active throughout. There are no masses palpable. No hepatomegaly. Rectal:  defered Neurological: Alert and oriented to person place and time. Skin: Skin is warm and dry. No rashes noted.  Data Reviewed: I have personally reviewed following labs and imaging studies  CBC: CBC Latest Ref Rng & Units  08/21/2019 08/15/2019 08/14/2019  WBC 4.0 - 10.5 K/uL 6.3 7.0 7.3  Hemoglobin 13.0 - 17.0 g/dL 8.6 Repeated and verified X2.(L) 7.5(L) 8.2(L)  Hematocrit 39.0 - 52.0 % 25.6(L) 23.5(L) 24.4(L)  Platelets 150.0 - 400.0 K/uL 356.0 321 309    CMP: CMP Latest Ref Rng & Units 08/21/2019 08/15/2019 08/14/2019  Glucose 70 - 99 mg/dL 92 102(H) 97  BUN 6 - 23 mg/dL 14 8 14   Creatinine 0.40 - 1.50 mg/dL 1.14 1.16 1.16  Sodium 135 - 145 mEq/L 142 139 138  Potassium 3.5 - 5.1 mEq/L 3.5 3.1(L) 3.4(L)  Chloride 96 - 112 mEq/L 109 111 109  CO2 19 - 32 mEq/L 26 22 22   Calcium 8.4 - 10.5 mg/dL 9.3 8.5(L) 8.4(L)  Total Protein 6.0 - 8.3 g/dL 6.6 - -  Total Bilirubin 0.2 - 1.2 mg/dL 0.6 - -  Alkaline Phos 39 - 117 U/L 31(L) - -  AST 0 - 37 U/L 12 - -  ALT 0 - 53 U/L 11 - -     No results found for this or any previous visit (from the past 240 hour(s)).    Radiology Studies: No results found.    Carmell Austria, MD 09/24/2019, 2:20 PM  Cc: Seward Carol, MD

## 2019-09-24 NOTE — Patient Instructions (Signed)
If you are age 72 or older, your body mass index should be between 23-30. Your Body mass index is 31.09 kg/m. If this is out of the aforementioned range listed, please consider follow up with your Primary Care Provider.  If you are age 80 or younger, your body mass index should be between 19-25. Your Body mass index is 31.09 kg/m. If this is out of the aformentioned range listed, please consider follow up with your Primary Care Provider.   Follow up in 12 weeks.   Thank you,  Dr. Jackquline Denmark

## 2020-01-01 IMAGING — CT CT ABD-PELV W/ CM
2 of 5 series · 16 of 46 positions shown, 18 images · IV contrast (omnipaque)
Comparison: CT 12/23/2017

CLINICAL DATA: Diarrhea with bloody stool

EXAM:
CT ABDOMEN AND PELVIS WITH CONTRAST
TECHNIQUE: Multidetector CT imaging of the abdomen and pelvis was performed
using the standard protocol following bolus administration of
intravenous contrast.
CONTRAST:  100mL OMNIPAQUE IOHEXOL 300 MG/ML  SOLN

[Series 3: abdomen 5.0 · axial · 0.83mm/px · z∈[+852,+1252]mm · 13 of 92 slices shown, 15 images]
[im 6/92  soft-tissue]
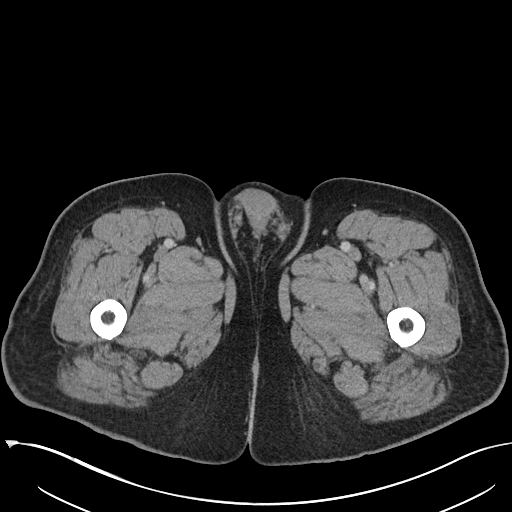
[im 6/92  bone]
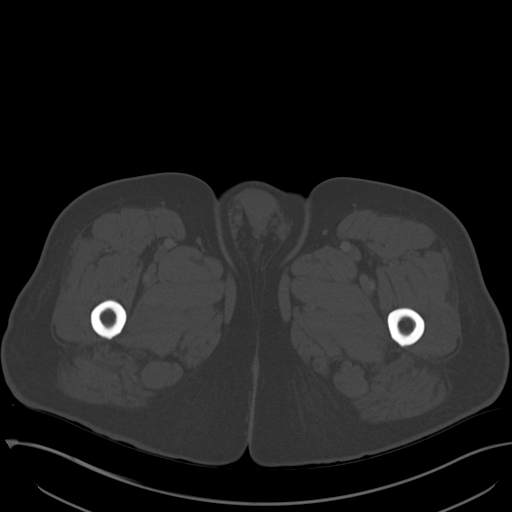
[im 11/92  soft-tissue]
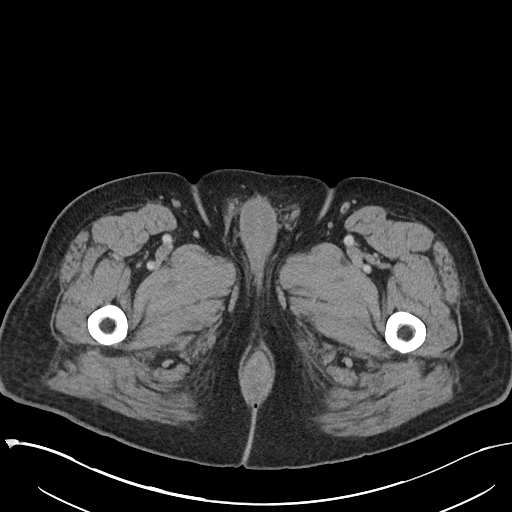
[im 22/92  soft-tissue]
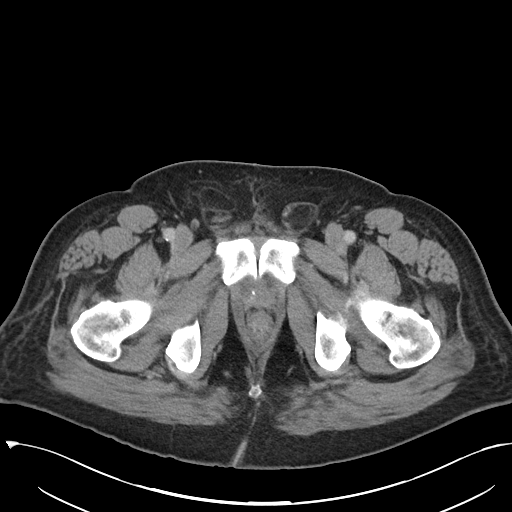
[im 27/92  soft-tissue]
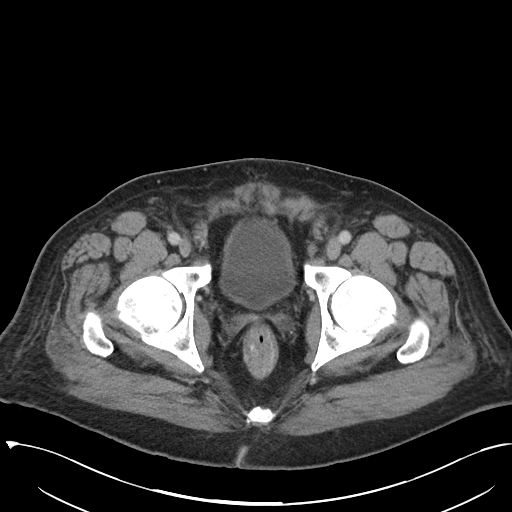
[im 33/92  soft-tissue]
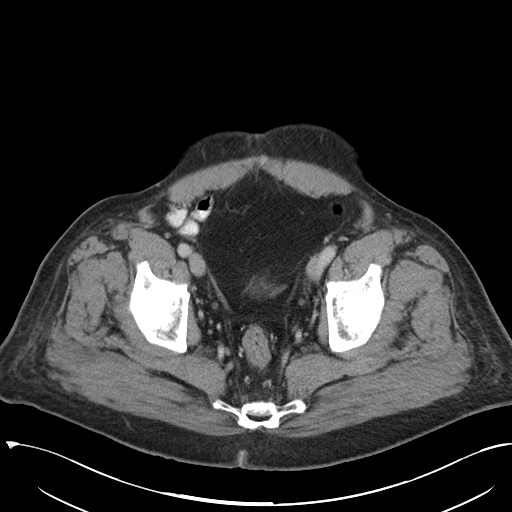
[im 38/92  soft-tissue]
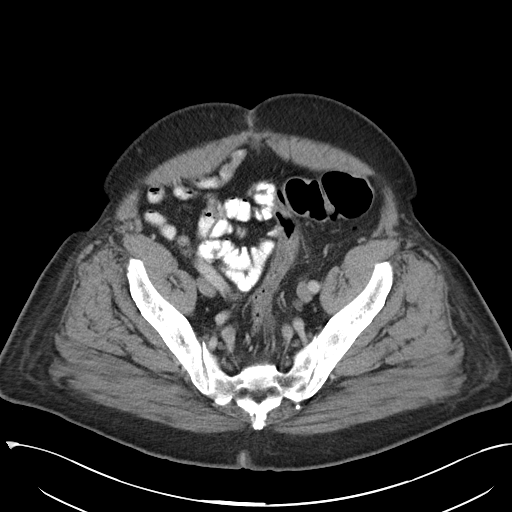
[im 49/92  soft-tissue]
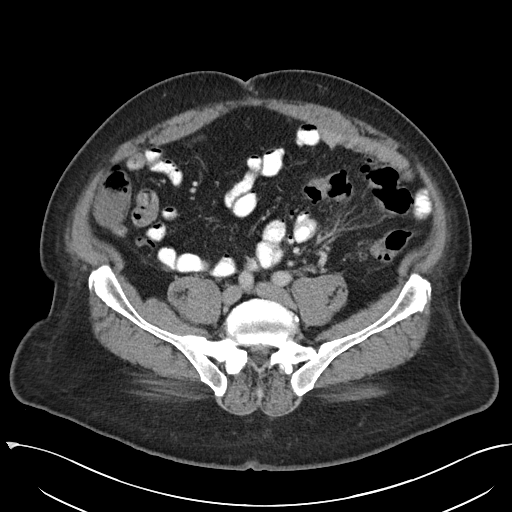
[im 54/92  soft-tissue]
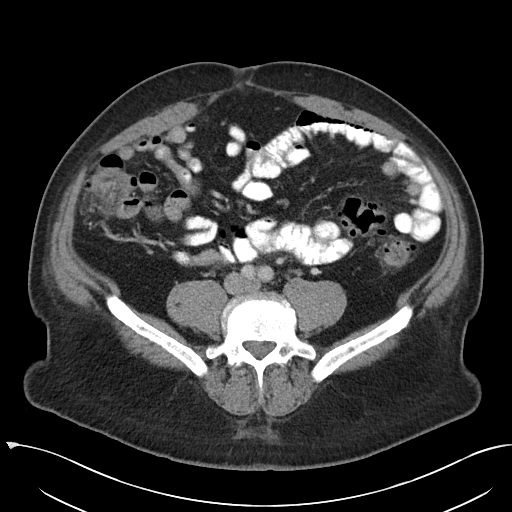
[im 59/92  soft-tissue]
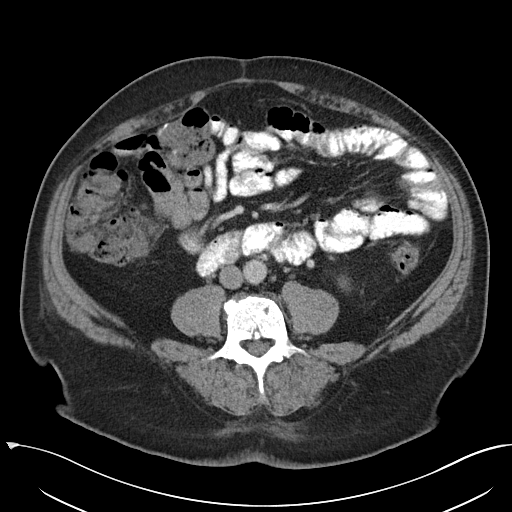
[im 59/92  bone]
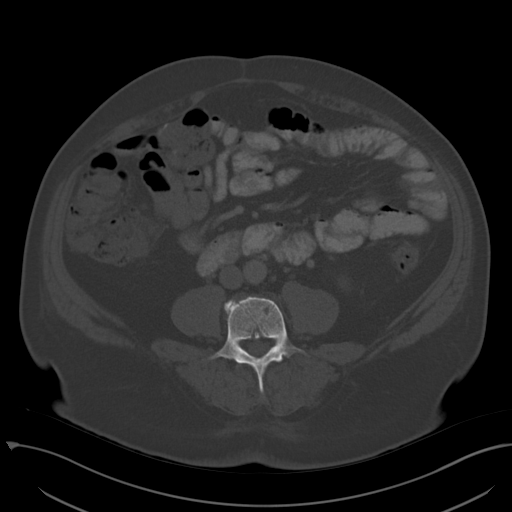
[im 65/92  soft-tissue]
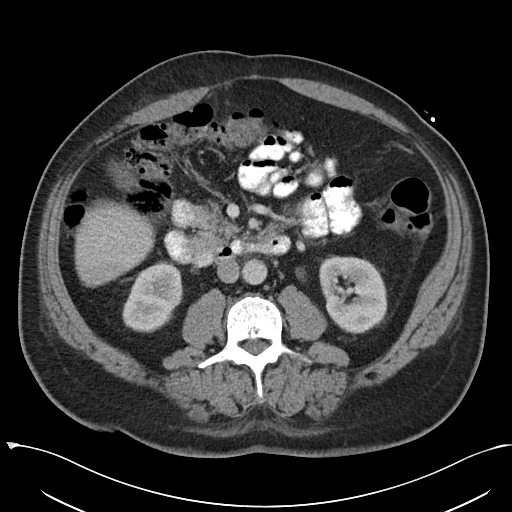
[im 70/92  soft-tissue]
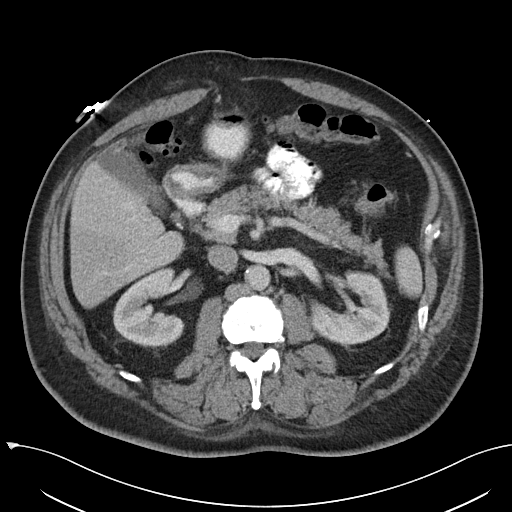
[im 81/92  soft-tissue]
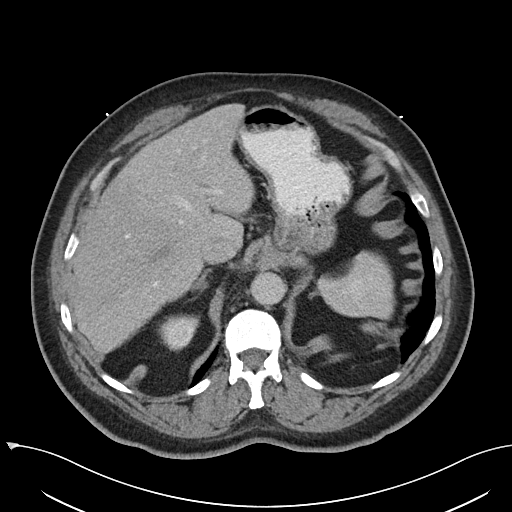
[im 86/92  soft-tissue]
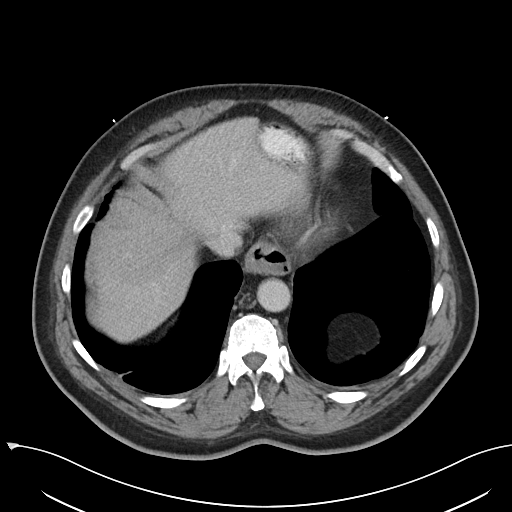

[Series 6: abdomen 3.0 mpr cor · coronal · 0.89mm/px · 3 of 120 slices shown]
[im 40/120  soft-tissue]
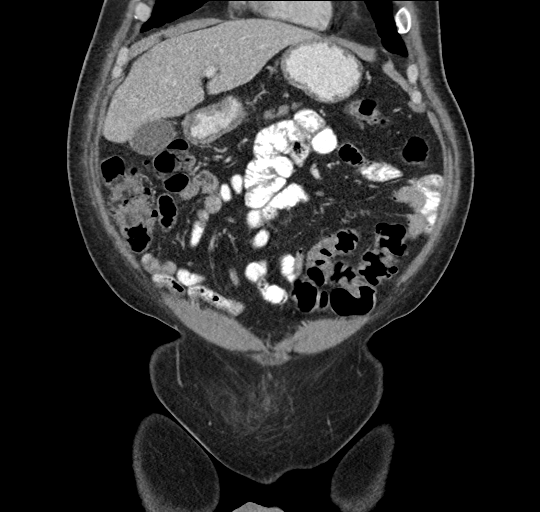
[im 53/120  soft-tissue]
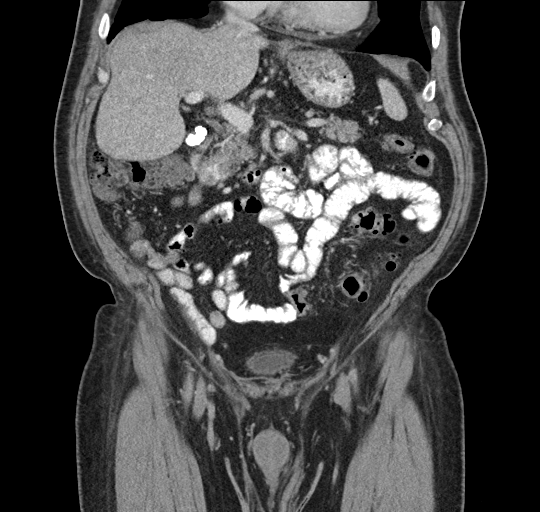
[im 67/120  soft-tissue]
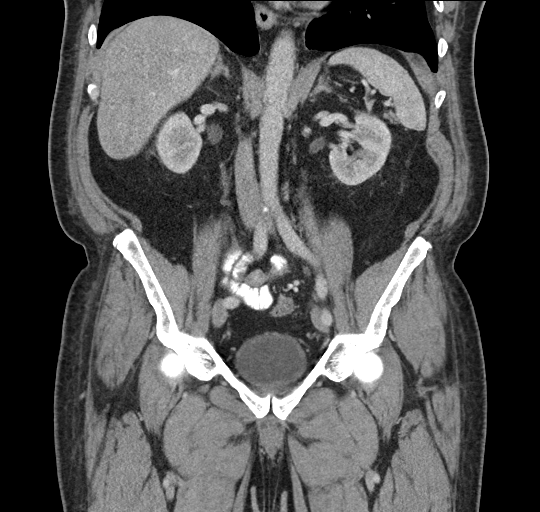

[16 of 46 positions shown; findings below may reference images not displayed]

FINDINGS: Lower chest: Lung bases demonstrate no acute consolidation or
pleural effusion. Heart size within normal limits. Small hiatal
hernia.

Hepatobiliary: Multiple calcified gallstones. No biliary dilatation.
No focal hepatic abnormality

Pancreas: Unremarkable. No pancreatic ductal dilatation or
surrounding inflammatory changes.

Spleen: Normal in size without focal abnormality.

Adrenals/Urinary Tract: Adrenal glands are unremarkable. Kidneys are
normal, without renal calculi, focal lesion, or hydronephrosis.
Bladder is unremarkable.

Stomach/Bowel: The stomach is nonenlarged. No dilated small bowel.
Postsurgical changes at the proximal transverse colon. Negative
appendix. Diffuse diverticular disease of the colon. Suspicion of
subtle fat stranding adjacent to the descending/sigmoid colon though
without significant wall thickening.

Vascular/Lymphatic: Mild aortic atherosclerosis. No aneurysm. No
significant adenopathy

Reproductive: Status post prostatectomy.

Other: Negative for free air or free fluid

Musculoskeletal: Degenerative changes without acute or suspicious
osseous abnormality
IMPRESSION: 1. Diffuse diverticular disease of the colon with suspicion of mild
fat stranding at the distal descending/sigmoid colon as may be seen
with mild acute diverticulitis. No appreciable wall thickening.
Negative for perforation or abscess.
2. Gallstones

## 2020-01-06 DIAGNOSIS — H2513 Age-related nuclear cataract, bilateral: Secondary | ICD-10-CM | POA: Diagnosis not present

## 2020-07-23 DIAGNOSIS — F4321 Adjustment disorder with depressed mood: Secondary | ICD-10-CM | POA: Diagnosis not present

## 2020-07-23 DIAGNOSIS — I10 Essential (primary) hypertension: Secondary | ICD-10-CM | POA: Diagnosis not present

## 2020-07-23 DIAGNOSIS — Z8546 Personal history of malignant neoplasm of prostate: Secondary | ICD-10-CM | POA: Diagnosis not present

## 2020-08-18 ENCOUNTER — Other Ambulatory Visit: Payer: Self-pay | Admitting: Gastroenterology

## 2020-08-18 DIAGNOSIS — Z8719 Personal history of other diseases of the digestive system: Secondary | ICD-10-CM

## 2020-08-18 DIAGNOSIS — D509 Iron deficiency anemia, unspecified: Secondary | ICD-10-CM

## 2020-09-13 DIAGNOSIS — Z23 Encounter for immunization: Secondary | ICD-10-CM | POA: Diagnosis not present

## 2020-11-20 DIAGNOSIS — Z8546 Personal history of malignant neoplasm of prostate: Secondary | ICD-10-CM | POA: Diagnosis not present

## 2020-11-20 DIAGNOSIS — F4321 Adjustment disorder with depressed mood: Secondary | ICD-10-CM | POA: Diagnosis not present

## 2020-11-20 DIAGNOSIS — I10 Essential (primary) hypertension: Secondary | ICD-10-CM | POA: Diagnosis not present

## 2021-02-09 DIAGNOSIS — F322 Major depressive disorder, single episode, severe without psychotic features: Secondary | ICD-10-CM | POA: Diagnosis not present

## 2021-02-09 DIAGNOSIS — F4321 Adjustment disorder with depressed mood: Secondary | ICD-10-CM | POA: Diagnosis not present

## 2021-02-09 DIAGNOSIS — Z8546 Personal history of malignant neoplasm of prostate: Secondary | ICD-10-CM | POA: Diagnosis not present

## 2021-02-09 DIAGNOSIS — I10 Essential (primary) hypertension: Secondary | ICD-10-CM | POA: Diagnosis not present

## 2021-03-03 DIAGNOSIS — F322 Major depressive disorder, single episode, severe without psychotic features: Secondary | ICD-10-CM | POA: Diagnosis not present

## 2021-03-03 DIAGNOSIS — Z8719 Personal history of other diseases of the digestive system: Secondary | ICD-10-CM | POA: Diagnosis not present

## 2021-03-03 DIAGNOSIS — G4730 Sleep apnea, unspecified: Secondary | ICD-10-CM | POA: Diagnosis not present

## 2021-03-03 DIAGNOSIS — Z0000 Encounter for general adult medical examination without abnormal findings: Secondary | ICD-10-CM | POA: Diagnosis not present

## 2021-03-03 DIAGNOSIS — Z8546 Personal history of malignant neoplasm of prostate: Secondary | ICD-10-CM | POA: Diagnosis not present

## 2021-03-03 DIAGNOSIS — K449 Diaphragmatic hernia without obstruction or gangrene: Secondary | ICD-10-CM | POA: Diagnosis not present

## 2021-03-03 DIAGNOSIS — I10 Essential (primary) hypertension: Secondary | ICD-10-CM | POA: Diagnosis not present

## 2021-03-18 DIAGNOSIS — I10 Essential (primary) hypertension: Secondary | ICD-10-CM | POA: Diagnosis not present

## 2021-03-18 DIAGNOSIS — Z0001 Encounter for general adult medical examination with abnormal findings: Secondary | ICD-10-CM | POA: Diagnosis not present

## 2021-04-16 DIAGNOSIS — Z8546 Personal history of malignant neoplasm of prostate: Secondary | ICD-10-CM | POA: Diagnosis not present

## 2021-04-16 DIAGNOSIS — F4321 Adjustment disorder with depressed mood: Secondary | ICD-10-CM | POA: Diagnosis not present

## 2021-04-16 DIAGNOSIS — I10 Essential (primary) hypertension: Secondary | ICD-10-CM | POA: Diagnosis not present

## 2021-04-16 DIAGNOSIS — F322 Major depressive disorder, single episode, severe without psychotic features: Secondary | ICD-10-CM | POA: Diagnosis not present

## 2021-06-02 DIAGNOSIS — Z23 Encounter for immunization: Secondary | ICD-10-CM | POA: Diagnosis not present

## 2021-07-02 DIAGNOSIS — R197 Diarrhea, unspecified: Secondary | ICD-10-CM | POA: Diagnosis not present

## 2021-07-15 DIAGNOSIS — F4321 Adjustment disorder with depressed mood: Secondary | ICD-10-CM | POA: Diagnosis not present

## 2021-07-15 DIAGNOSIS — F322 Major depressive disorder, single episode, severe without psychotic features: Secondary | ICD-10-CM | POA: Diagnosis not present

## 2021-07-15 DIAGNOSIS — I10 Essential (primary) hypertension: Secondary | ICD-10-CM | POA: Diagnosis not present

## 2021-07-15 DIAGNOSIS — Z8546 Personal history of malignant neoplasm of prostate: Secondary | ICD-10-CM | POA: Diagnosis not present

## 2021-09-02 DIAGNOSIS — I10 Essential (primary) hypertension: Secondary | ICD-10-CM | POA: Diagnosis not present

## 2021-09-02 DIAGNOSIS — Z5181 Encounter for therapeutic drug level monitoring: Secondary | ICD-10-CM | POA: Diagnosis not present

## 2021-09-02 DIAGNOSIS — G4730 Sleep apnea, unspecified: Secondary | ICD-10-CM | POA: Diagnosis not present

## 2021-09-02 DIAGNOSIS — F322 Major depressive disorder, single episode, severe without psychotic features: Secondary | ICD-10-CM | POA: Diagnosis not present

## 2021-10-16 DIAGNOSIS — I10 Essential (primary) hypertension: Secondary | ICD-10-CM | POA: Diagnosis not present

## 2021-10-16 DIAGNOSIS — F322 Major depressive disorder, single episode, severe without psychotic features: Secondary | ICD-10-CM | POA: Diagnosis not present

## 2021-10-16 DIAGNOSIS — F4321 Adjustment disorder with depressed mood: Secondary | ICD-10-CM | POA: Diagnosis not present

## 2021-10-16 DIAGNOSIS — Z8546 Personal history of malignant neoplasm of prostate: Secondary | ICD-10-CM | POA: Diagnosis not present

## 2022-01-19 DIAGNOSIS — Z20822 Contact with and (suspected) exposure to covid-19: Secondary | ICD-10-CM | POA: Diagnosis not present

## 2022-03-05 DIAGNOSIS — F322 Major depressive disorder, single episode, severe without psychotic features: Secondary | ICD-10-CM | POA: Diagnosis not present

## 2022-03-05 DIAGNOSIS — G4730 Sleep apnea, unspecified: Secondary | ICD-10-CM | POA: Diagnosis not present

## 2022-03-05 DIAGNOSIS — Z8546 Personal history of malignant neoplasm of prostate: Secondary | ICD-10-CM | POA: Diagnosis not present

## 2022-03-05 DIAGNOSIS — I10 Essential (primary) hypertension: Secondary | ICD-10-CM | POA: Diagnosis not present

## 2022-03-05 DIAGNOSIS — Z0000 Encounter for general adult medical examination without abnormal findings: Secondary | ICD-10-CM | POA: Diagnosis not present

## 2022-03-05 DIAGNOSIS — R109 Unspecified abdominal pain: Secondary | ICD-10-CM | POA: Diagnosis not present

## 2022-03-15 DIAGNOSIS — Z20822 Contact with and (suspected) exposure to covid-19: Secondary | ICD-10-CM | POA: Diagnosis not present

## 2022-04-07 DIAGNOSIS — R7989 Other specified abnormal findings of blood chemistry: Secondary | ICD-10-CM | POA: Diagnosis not present

## 2022-05-14 DIAGNOSIS — M25512 Pain in left shoulder: Secondary | ICD-10-CM | POA: Diagnosis not present

## 2022-12-28 ENCOUNTER — Encounter (HOSPITAL_COMMUNITY): Payer: Self-pay

## 2022-12-28 ENCOUNTER — Ambulatory Visit (HOSPITAL_COMMUNITY)
Admission: EM | Admit: 2022-12-28 | Discharge: 2022-12-28 | Disposition: A | Discharge status: Home / Self Care | Payer: Medicare Other | Attending: Family Medicine | Admitting: Family Medicine

## 2022-12-28 DIAGNOSIS — R42 Dizziness and giddiness: Secondary | ICD-10-CM | POA: Diagnosis not present

## 2022-12-28 DIAGNOSIS — H6123 Impacted cerumen, bilateral: Secondary | ICD-10-CM

## 2022-12-28 HISTORY — DX: Dizziness and giddiness: R42

## 2022-12-28 LAB — CBC WITH DIFFERENTIAL/PLATELET
Abs Immature Granulocytes: 0.01 10*3/uL (ref 0.00–0.07)
Basophils Absolute: 0 10*3/uL (ref 0.0–0.1)
Basophils Relative: 0 %
Eosinophils Absolute: 0.3 10*3/uL (ref 0.0–0.5)
Eosinophils Relative: 5 %
HCT: 42.7 % (ref 39.0–52.0)
Hemoglobin: 14 g/dL (ref 13.0–17.0)
Immature Granulocytes: 0 %
Lymphocytes Relative: 32 %
Lymphs Abs: 1.7 10*3/uL (ref 0.7–4.0)
MCH: 29.4 pg (ref 26.0–34.0)
MCHC: 32.8 g/dL (ref 30.0–36.0)
MCV: 89.5 fL (ref 80.0–100.0)
Monocytes Absolute: 0.6 10*3/uL (ref 0.1–1.0)
Monocytes Relative: 11 %
Neutro Abs: 2.9 10*3/uL (ref 1.7–7.7)
Neutrophils Relative %: 52 %
Platelets: 212 10*3/uL (ref 150–400)
RBC: 4.77 MIL/uL (ref 4.22–5.81)
RDW: 13.9 % (ref 11.5–15.5)
WBC: 5.5 10*3/uL (ref 4.0–10.5)
nRBC: 0 % (ref 0.0–0.2)

## 2022-12-28 LAB — BASIC METABOLIC PANEL
Anion gap: 7 (ref 5–15)
BUN: 12 mg/dL (ref 8–23)
CO2: 25 mmol/L (ref 22–32)
Calcium: 9.6 mg/dL (ref 8.9–10.3)
Chloride: 108 mmol/L (ref 98–111)
Creatinine, Ser: 1.09 mg/dL (ref 0.61–1.24)
GFR, Estimated: >60 mL/min (ref >60)
Glucose, Bld: 86 mg/dL (ref 70–99)
Potassium: 4 mmol/L (ref 3.5–5.1)
Sodium: 140 mmol/L (ref 135–145)

## 2022-12-28 LAB — POCT URINALYSIS DIPSTICK, ED / UC
Bilirubin Urine: NEGATIVE
Glucose, UA: NEGATIVE mg/dL
Hgb urine dipstick: NEGATIVE
Ketones, ur: NEGATIVE mg/dL
Leukocytes,Ua: NEGATIVE
Nitrite: NEGATIVE
Protein, ur: NEGATIVE mg/dL
Specific Gravity, Urine: >=1.03 (ref 1.005–1.030)
Urobilinogen, UA: 0.2 mg/dL (ref 0.0–1.0)
pH: 5.5 (ref 5.0–8.0)

## 2022-12-28 LAB — CBG MONITORING, ED: Glucose-Capillary: 88 mg/dL (ref 70–99)

## 2022-12-28 NOTE — Discharge Instructions (Addendum)
You were seen today for dizziness.  Your work up today was normal, including your urine, blood sugar and EKG.  Your blood pressure is not too low causing your dizziness.  I have ordered lab work which will be resulted later today or tomorrow.  This will be visible on mychart.  If anything is abnormal causing your symptoms you will be notified.  Your dizziness may be vertigo related.  I recommend you get plenty of fluids and rest.  If this does not improve then please follow up with your primary care provider for further care.  If you have further confusion, then please go to the ER for evaluation that we cannot do here.

## 2022-12-28 NOTE — ED Triage Notes (Signed)
Patient states he has been having dizziness and mild headache x 3 days. Patient 's wife reports that he acted somewhat confused. This AM. Wife states it was 0730 and he thought it was 0830.  Patient denies any nausea, but states the dizziness occurs when he is trying to get up. Patient reports a history of vertigo.Marland Kitchen

## 2022-12-28 NOTE — ED Provider Notes (Addendum)
[facility]    CSN: [id number] Arrival date & time: 12/28/22  4098      History   Chief Complaint Chief Complaint  Patient presents with   Dizziness    HPI Maurice Vang is a 77 y.o. male.   Patient is here for dizziness.  He woke up 4 days ago while getting out of bed.  He thought he got up too quickly.  Room was spinning.  He layed back down and did not go away.  He stayed in bed most of the day.  Every time he would get up this would happen.  Sunday he did feel better.  He went to church.  He was still slightly dizzy.  He is eating well overall.  Yesterday it started again, not as severe as Saturday, but still layed around yesterday all day.  Some light headedness and some room spinning.  Today his wife states he was "confused".  He had the time wrong this morning about a scheduled visit.  No other episodes of confusion.  No chest pain or sob.  He is still having dizziness if he gets up.  He did have similar episodes last year which was vertigo.   On Sunday bp was 100/60, and only took the hctz.   He has not taken his bp medications the last two days.  He has been on those meds for a while.  He has seen his dr regularly.   He states he has lab work in the last 6 months.   Last week he had URI symptoms, sinus issues, cough, but is better.  No fevers/chills.   No n/v.  No constipation, but some loose stools.  No urinary symptoms.  No changes in medications.        Past Medical History:  Diagnosis Date   Cancer [facility])    prostate   Hypertension    Vertigo     Patient Active Problem List   Diagnosis Date Noted   Fecal occult blood test positive    Anemia associated with acute blood loss 08/12/2019   Hypertension 08/12/2019   History of prostate cancer 08/12/2019   Symptomatic anemia 08/12/2019    Past Surgical History:  Procedure Laterality Date   BIOPSY  08/14/2019   Procedure: BIOPSY;  Surgeon: Lynann Bologna, MD;  Location: [facility] ENDOSCOPY;   Service: Endoscopy;;   COLONOSCOPY WITH PROPOFOL N/A 08/15/2019   Procedure: COLONOSCOPY WITH PROPOFOL;  Surgeon: Lynann Bologna, MD;  Location: [facility] ENDOSCOPY;  Service: Endoscopy;  Laterality: N/A;   ESOPHAGOGASTRODUODENOSCOPY (EGD) WITH PROPOFOL N/A 08/14/2019   Procedure: ESOPHAGOGASTRODUODENOSCOPY (EGD) WITH PROPOFOL;  Surgeon: Lynann Bologna, MD;  Location: [facility] ENDOSCOPY;  Service: Endoscopy;  Laterality: N/A;   prostate cancer     PROSTATE SURGERY     SMALL INTESTINE SURGERY         Home Medications    Prior to Admission medications   Medication Sig Start Date End Date Taking? Authorizing Provider  ALPRAZolam (XANAX) 0.25 MG tablet 1 tablet daily. 08/20/19   [provider]  Cholecalciferol ([employer] VIT D 5000 HIGH-POTENCY PO) Take 1 tablet by mouth daily.    [provider]  escitalopram (LEXAPRO) 10 MG tablet Take 10 mg by mouth daily. 09/17/19   [provider]  ferrous sulfate 325 (65 FE) MG tablet Take 1 tablet (325 mg total) by mouth daily. 08/16/19 08/15/20  Marinda Elk, MD  hydrochlorothiazide (HYDRODIURIL) 25 MG tablet Take 25 mg by mouth daily.  [provider]  ramipril (ALTACE) 5 MG capsule Take 5 mg by mouth daily.    [provider]    Family History Family History  Problem Relation Age of Onset   Stroke Mother    Stroke Father    Colon cancer Neg Hx    Esophageal cancer Neg Hx     Social History Social History   Tobacco Use   Smoking status: Former    Types: Cigarettes    Quit date: 1990    Years since quitting: 34.1   Smokeless tobacco: Never  Vaping Use   Vaping Use: Never used  Substance Use Topics   Alcohol use: No   Drug use: No     Allergies   Codeine   Review of Systems Review of Systems  Constitutional: Negative.   HENT:  Positive for congestion and rhinorrhea.   Respiratory:  Positive for cough.   Cardiovascular:  Negative for chest pain and palpitations.  Gastrointestinal: Negative.    Genitourinary: Negative.   Musculoskeletal: Negative.   Neurological:  Positive for dizziness.  Psychiatric/Behavioral: Negative.       Physical Exam Triage Vital Signs ED Triage Vitals [12/28/22 1214]  Enc Vitals Group     BP (!) 151/84     Pulse Rate 64     Resp 20     Temp 98.5 F (36.9 C)     Temp Source Oral     SpO2 98 %     Weight      Height      Head Circumference      Peak Flow      Pain Score 2     Pain Loc      Pain Edu?      Excl. in GC?    No data found.  Updated Vital Signs BP (!) 151/84 (BP Location: Left Arm)   Pulse 64   Temp 98.5 F (36.9 C) (Oral)   Resp 20   SpO2 98%   Visual Acuity Right Eye Distance:   Left Eye Distance:   Bilateral Distance:    Right Eye Near:   Left Eye Near:    Bilateral Near:     Physical Exam Constitutional:      Appearance: Normal appearance.  HENT:     Head: Normocephalic and atraumatic.     Right Ear: There is impacted cerumen.     Left Ear: There is impacted cerumen.     Nose: Rhinorrhea present. No congestion.     Mouth/Throat:     Mouth: Mucous membranes are moist.  Eyes:     Pupils: Pupils are equal, round, and reactive to light.  Cardiovascular:     Rate and Rhythm: Normal rate and regular rhythm.  Pulmonary:     Effort: Pulmonary effort is normal.     Breath sounds: Normal breath sounds. No wheezing.  Musculoskeletal:     Cervical back: Normal range of motion and neck supple. No tenderness.  Lymphadenopathy:     Cervical: No cervical adenopathy.  Skin:    General: Skin is warm and dry.  Neurological:     General: No focal deficit present.     Mental Status: He is alert and oriented to person, place, and time.     Motor: No weakness.     Coordination: Coordination normal.     Gait: Gait normal.     Comments: Slight dizziness with dik-halpike on the right  Psychiatric:        Mood  and Affect: Mood normal.        Behavior: Behavior normal.     UC Treatments / Results  Labs (all  labs ordered are listed, but only abnormal results are displayed) Labs Reviewed  CBC WITH DIFFERENTIAL/PLATELET  BASIC METABOLIC PANEL  POCT URINALYSIS DIPSTICK, ED / UC  CBG MONITORING, ED   CBG 88 Ua: normal  EKG SR, normal EKG, no changes from prior  Radiology No results found.  Procedures  Both ears were irrigated; the right ear was was successfully removed;  the left had small pieces removed but not all wax removed;   Medications Ordered in UC Medications - No data to display  Initial Impression / Assessment and Plan / UC Course  I have reviewed the triage vital signs and the nursing notes.  Pertinent labs & imaging results that were available during my care of the patient were reviewed by me and considered in my medical decision making (see chart for details).  Patient was seen today primarily for dizziness the last several days, and an episode of confusion this morning. Work up is normal here.  His vitals are stable, he is not orthostatic, neuro exam is normal.   Discussed that his dizziness is likely vertigo related.  I have ordered lab work for further work up.  His ears attempted to be cleaned out today.  Discussed that if not improving he needs to follow up with his primary care provider.  If he has further confusion then please go to the ER for evaluation  Final Clinical Impressions(s) / UC Diagnoses   Final diagnoses:  Dizziness  Vertigo  Bilateral impacted cerumen     Discharge Instructions      You were seen today for dizziness.  Your work up today was normal, including your urine, blood sugar and EKG.  Your blood pressure is not too low causing your dizziness.  I have ordered lab work which will be resulted later today or tomorrow.  This will be visible on mychart.  If anything is abnormal causing your symptoms you will be notified.  Your dizziness may be vertigo related.  I recommend you get plenty of fluids and rest.  If this does not improve then  please follow up with your primary care provider for further care.  If you have further confusion, then please go to the ER for evaluation that we cannot do here.     ED Prescriptions   None    PDMP not reviewed this encounter.   Jannifer Franklin, MD 12/28/22 1610    Jannifer Franklin, MD 12/30/22 [phone]

## 2023-05-03 DIAGNOSIS — M1712 Unilateral primary osteoarthritis, left knee: Secondary | ICD-10-CM | POA: Diagnosis not present

## 2023-05-10 DIAGNOSIS — M1712 Unilateral primary osteoarthritis, left knee: Secondary | ICD-10-CM | POA: Diagnosis not present

## 2023-05-19 DIAGNOSIS — M1712 Unilateral primary osteoarthritis, left knee: Secondary | ICD-10-CM | POA: Diagnosis not present

## 2023-10-26 DIAGNOSIS — Z1211 Encounter for screening for malignant neoplasm of colon: Secondary | ICD-10-CM | POA: Diagnosis not present

## 2023-10-26 DIAGNOSIS — I10 Essential (primary) hypertension: Secondary | ICD-10-CM | POA: Diagnosis not present

## 2023-10-26 DIAGNOSIS — Z23 Encounter for immunization: Secondary | ICD-10-CM | POA: Diagnosis not present

## 2023-10-26 DIAGNOSIS — E663 Overweight: Secondary | ICD-10-CM | POA: Diagnosis not present

## 2023-10-26 DIAGNOSIS — G4730 Sleep apnea, unspecified: Secondary | ICD-10-CM | POA: Diagnosis not present

## 2023-10-26 DIAGNOSIS — Z5181 Encounter for therapeutic drug level monitoring: Secondary | ICD-10-CM | POA: Diagnosis not present

## 2023-10-26 DIAGNOSIS — Z8546 Personal history of malignant neoplasm of prostate: Secondary | ICD-10-CM | POA: Diagnosis not present

## 2023-10-26 DIAGNOSIS — F322 Major depressive disorder, single episode, severe without psychotic features: Secondary | ICD-10-CM | POA: Diagnosis not present

## 2024-01-16 DIAGNOSIS — I10 Essential (primary) hypertension: Secondary | ICD-10-CM | POA: Diagnosis not present

## 2024-02-06 DIAGNOSIS — Z8546 Personal history of malignant neoplasm of prostate: Secondary | ICD-10-CM | POA: Diagnosis not present

## 2024-02-06 DIAGNOSIS — I10 Essential (primary) hypertension: Secondary | ICD-10-CM | POA: Diagnosis not present

## 2024-02-06 DIAGNOSIS — F322 Major depressive disorder, single episode, severe without psychotic features: Secondary | ICD-10-CM | POA: Diagnosis not present

## 2024-02-06 DIAGNOSIS — E663 Overweight: Secondary | ICD-10-CM | POA: Diagnosis not present

## 2024-02-14 DIAGNOSIS — I10 Essential (primary) hypertension: Secondary | ICD-10-CM | POA: Diagnosis not present

## 2024-03-07 DIAGNOSIS — E663 Overweight: Secondary | ICD-10-CM | POA: Diagnosis not present

## 2024-03-07 DIAGNOSIS — I10 Essential (primary) hypertension: Secondary | ICD-10-CM | POA: Diagnosis not present

## 2024-03-07 DIAGNOSIS — F322 Major depressive disorder, single episode, severe without psychotic features: Secondary | ICD-10-CM | POA: Diagnosis not present

## 2024-03-07 DIAGNOSIS — Z8546 Personal history of malignant neoplasm of prostate: Secondary | ICD-10-CM | POA: Diagnosis not present

## 2024-03-15 DIAGNOSIS — I10 Essential (primary) hypertension: Secondary | ICD-10-CM | POA: Diagnosis not present

## 2024-04-07 DIAGNOSIS — Z8546 Personal history of malignant neoplasm of prostate: Secondary | ICD-10-CM | POA: Diagnosis not present

## 2024-04-07 DIAGNOSIS — E663 Overweight: Secondary | ICD-10-CM | POA: Diagnosis not present

## 2024-04-07 DIAGNOSIS — I10 Essential (primary) hypertension: Secondary | ICD-10-CM | POA: Diagnosis not present

## 2024-04-07 DIAGNOSIS — F322 Major depressive disorder, single episode, severe without psychotic features: Secondary | ICD-10-CM | POA: Diagnosis not present

## 2024-04-14 DIAGNOSIS — I10 Essential (primary) hypertension: Secondary | ICD-10-CM | POA: Diagnosis not present

## 2024-05-07 DIAGNOSIS — Z8546 Personal history of malignant neoplasm of prostate: Secondary | ICD-10-CM | POA: Diagnosis not present

## 2024-05-07 DIAGNOSIS — F322 Major depressive disorder, single episode, severe without psychotic features: Secondary | ICD-10-CM | POA: Diagnosis not present

## 2024-05-07 DIAGNOSIS — I10 Essential (primary) hypertension: Secondary | ICD-10-CM | POA: Diagnosis not present

## 2024-05-07 DIAGNOSIS — E663 Overweight: Secondary | ICD-10-CM | POA: Diagnosis not present

## 2024-05-14 DIAGNOSIS — I10 Essential (primary) hypertension: Secondary | ICD-10-CM | POA: Diagnosis not present

## 2024-05-15 DIAGNOSIS — F322 Major depressive disorder, single episode, severe without psychotic features: Secondary | ICD-10-CM | POA: Diagnosis not present

## 2024-05-15 DIAGNOSIS — Z5181 Encounter for therapeutic drug level monitoring: Secondary | ICD-10-CM | POA: Diagnosis not present

## 2024-05-15 DIAGNOSIS — G4733 Obstructive sleep apnea (adult) (pediatric): Secondary | ICD-10-CM | POA: Diagnosis not present

## 2024-05-15 DIAGNOSIS — Z8546 Personal history of malignant neoplasm of prostate: Secondary | ICD-10-CM | POA: Diagnosis not present

## 2024-05-15 DIAGNOSIS — I10 Essential (primary) hypertension: Secondary | ICD-10-CM | POA: Diagnosis not present

## 2024-05-15 DIAGNOSIS — R42 Dizziness and giddiness: Secondary | ICD-10-CM | POA: Diagnosis not present

## 2024-06-07 DIAGNOSIS — E663 Overweight: Secondary | ICD-10-CM | POA: Diagnosis not present

## 2024-06-07 DIAGNOSIS — Z8546 Personal history of malignant neoplasm of prostate: Secondary | ICD-10-CM | POA: Diagnosis not present

## 2024-06-07 DIAGNOSIS — F322 Major depressive disorder, single episode, severe without psychotic features: Secondary | ICD-10-CM | POA: Diagnosis not present

## 2024-06-07 DIAGNOSIS — I10 Essential (primary) hypertension: Secondary | ICD-10-CM | POA: Diagnosis not present

## 2024-06-11 DIAGNOSIS — I10 Essential (primary) hypertension: Secondary | ICD-10-CM | POA: Diagnosis not present

## 2024-06-11 DIAGNOSIS — G4733 Obstructive sleep apnea (adult) (pediatric): Secondary | ICD-10-CM | POA: Diagnosis not present

## 2024-06-13 DIAGNOSIS — I10 Essential (primary) hypertension: Secondary | ICD-10-CM | POA: Diagnosis not present

## 2024-07-08 DIAGNOSIS — E663 Overweight: Secondary | ICD-10-CM | POA: Diagnosis not present

## 2024-07-08 DIAGNOSIS — Z8546 Personal history of malignant neoplasm of prostate: Secondary | ICD-10-CM | POA: Diagnosis not present

## 2024-07-08 DIAGNOSIS — F322 Major depressive disorder, single episode, severe without psychotic features: Secondary | ICD-10-CM | POA: Diagnosis not present

## 2024-07-08 DIAGNOSIS — I10 Essential (primary) hypertension: Secondary | ICD-10-CM | POA: Diagnosis not present

## 2024-07-13 DIAGNOSIS — I10 Essential (primary) hypertension: Secondary | ICD-10-CM | POA: Diagnosis not present

## 2024-08-07 DIAGNOSIS — I10 Essential (primary) hypertension: Secondary | ICD-10-CM | POA: Diagnosis not present

## 2024-08-07 DIAGNOSIS — Z8546 Personal history of malignant neoplasm of prostate: Secondary | ICD-10-CM | POA: Diagnosis not present

## 2024-08-07 DIAGNOSIS — E663 Overweight: Secondary | ICD-10-CM | POA: Diagnosis not present

## 2024-08-07 DIAGNOSIS — F322 Major depressive disorder, single episode, severe without psychotic features: Secondary | ICD-10-CM | POA: Diagnosis not present

## 2024-08-08 DIAGNOSIS — I10 Essential (primary) hypertension: Secondary | ICD-10-CM | POA: Diagnosis not present

## 2024-08-08 DIAGNOSIS — Z1331 Encounter for screening for depression: Secondary | ICD-10-CM | POA: Diagnosis not present

## 2024-08-08 DIAGNOSIS — Z8546 Personal history of malignant neoplasm of prostate: Secondary | ICD-10-CM | POA: Diagnosis not present

## 2024-08-08 DIAGNOSIS — Z0000 Encounter for general adult medical examination without abnormal findings: Secondary | ICD-10-CM | POA: Diagnosis not present

## 2024-08-08 DIAGNOSIS — F322 Major depressive disorder, single episode, severe without psychotic features: Secondary | ICD-10-CM | POA: Diagnosis not present

## 2024-08-08 DIAGNOSIS — G4733 Obstructive sleep apnea (adult) (pediatric): Secondary | ICD-10-CM | POA: Diagnosis not present

## 2024-08-12 DIAGNOSIS — I10 Essential (primary) hypertension: Secondary | ICD-10-CM | POA: Diagnosis not present

## 2024-09-07 DIAGNOSIS — Z8546 Personal history of malignant neoplasm of prostate: Secondary | ICD-10-CM | POA: Diagnosis not present

## 2024-09-07 DIAGNOSIS — F322 Major depressive disorder, single episode, severe without psychotic features: Secondary | ICD-10-CM | POA: Diagnosis not present

## 2024-09-07 DIAGNOSIS — I10 Essential (primary) hypertension: Secondary | ICD-10-CM | POA: Diagnosis not present

## 2024-09-07 DIAGNOSIS — E663 Overweight: Secondary | ICD-10-CM | POA: Diagnosis not present

## 2024-09-19 DIAGNOSIS — I10 Essential (primary) hypertension: Secondary | ICD-10-CM | POA: Diagnosis not present

## 2024-10-07 DIAGNOSIS — E663 Overweight: Secondary | ICD-10-CM | POA: Diagnosis not present

## 2024-10-07 DIAGNOSIS — F322 Major depressive disorder, single episode, severe without psychotic features: Secondary | ICD-10-CM | POA: Diagnosis not present

## 2024-10-07 DIAGNOSIS — Z8546 Personal history of malignant neoplasm of prostate: Secondary | ICD-10-CM | POA: Diagnosis not present

## 2024-10-07 DIAGNOSIS — I10 Essential (primary) hypertension: Secondary | ICD-10-CM | POA: Diagnosis not present
